# Patient Record
Sex: Female | Born: 1995 | Hispanic: Yes | Marital: Single | Smoking: Former smoker
Health system: Southern US, Community
[De-identification: ages and names within clinical notes are randomized; demographics above are authoritative.]

## PROBLEM LIST (undated history)

## (undated) ENCOUNTER — Inpatient Hospital Stay (HOSPITAL_COMMUNITY): Payer: Self-pay

## (undated) DIAGNOSIS — Z87440 Personal history of urinary (tract) infections: Secondary | ICD-10-CM

## (undated) DIAGNOSIS — I10 Essential (primary) hypertension: Secondary | ICD-10-CM

## (undated) DIAGNOSIS — F419 Anxiety disorder, unspecified: Secondary | ICD-10-CM

## (undated) DIAGNOSIS — J45909 Unspecified asthma, uncomplicated: Secondary | ICD-10-CM

## (undated) DIAGNOSIS — D649 Anemia, unspecified: Secondary | ICD-10-CM

## (undated) DIAGNOSIS — Z789 Other specified health status: Secondary | ICD-10-CM

## (undated) HISTORY — DX: Essential (primary) hypertension: I10

## (undated) HISTORY — DX: Anxiety disorder, unspecified: F41.9

## (undated) HISTORY — DX: Personal history of urinary (tract) infections: Z87.440

## (undated) HISTORY — DX: Anemia, unspecified: D64.9

## (undated) HISTORY — PX: NO PAST SURGERIES: SHX2092

## (undated) HISTORY — DX: Unspecified asthma, uncomplicated: J45.909

## (undated) HISTORY — PX: OTHER SURGICAL HISTORY: SHX169

---
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
## 2016-01-12 ENCOUNTER — Inpatient Hospital Stay (HOSPITAL_COMMUNITY): Payer: Self-pay

## 2016-01-12 ENCOUNTER — Ambulatory Visit (INDEPENDENT_AMBULATORY_CARE_PROVIDER_SITE_OTHER): Payer: Self-pay | Admitting: Family Medicine

## 2016-01-12 ENCOUNTER — Inpatient Hospital Stay (HOSPITAL_COMMUNITY)
Admission: AD | Admit: 2016-01-12 | Discharge: 2016-01-12 | Disposition: A | Discharge status: Home / Self Care | Payer: Self-pay | Source: Ambulatory Visit | Attending: Obstetrics & Gynecology | Admitting: Obstetrics & Gynecology

## 2016-01-12 ENCOUNTER — Encounter (HOSPITAL_COMMUNITY): Payer: Self-pay | Admitting: *Deleted

## 2016-01-12 VITALS — BP 116/72 | HR 82 | Temp 98.3°F | Resp 18 | Ht 67.0 in | Wt 127.2 lb

## 2016-01-12 DIAGNOSIS — O23591 Infection of other part of genital tract in pregnancy, first trimester: Secondary | ICD-10-CM | POA: Insufficient documentation

## 2016-01-12 DIAGNOSIS — R1030 Lower abdominal pain, unspecified: Secondary | ICD-10-CM | POA: Insufficient documentation

## 2016-01-12 DIAGNOSIS — R109 Unspecified abdominal pain: Secondary | ICD-10-CM

## 2016-01-12 DIAGNOSIS — O209 Hemorrhage in early pregnancy, unspecified: Secondary | ICD-10-CM

## 2016-01-12 DIAGNOSIS — O219 Vomiting of pregnancy, unspecified: Secondary | ICD-10-CM

## 2016-01-12 DIAGNOSIS — O26891 Other specified pregnancy related conditions, first trimester: Secondary | ICD-10-CM | POA: Insufficient documentation

## 2016-01-12 DIAGNOSIS — Z87891 Personal history of nicotine dependence: Secondary | ICD-10-CM | POA: Insufficient documentation

## 2016-01-12 DIAGNOSIS — R112 Nausea with vomiting, unspecified: Secondary | ICD-10-CM | POA: Insufficient documentation

## 2016-01-12 DIAGNOSIS — Z3A01 Less than 8 weeks gestation of pregnancy: Secondary | ICD-10-CM | POA: Insufficient documentation

## 2016-01-12 DIAGNOSIS — N939 Abnormal uterine and vaginal bleeding, unspecified: Secondary | ICD-10-CM

## 2016-01-12 DIAGNOSIS — O26899 Other specified pregnancy related conditions, unspecified trimester: Secondary | ICD-10-CM

## 2016-01-12 DIAGNOSIS — O360111 Maternal care for anti-D [Rh] antibodies, first trimester, fetus 1: Secondary | ICD-10-CM

## 2016-01-12 HISTORY — DX: Other specified health status: Z78.9

## 2016-01-12 LAB — WET PREP, GENITAL
Sperm: NONE SEEN
TRICH WET PREP: NONE SEEN
YEAST WET PREP: NONE SEEN

## 2016-01-12 LAB — CBC WITH DIFFERENTIAL/PLATELET
BASOS PCT: 0 %
Basophils Absolute: 0 10*3/uL (ref 0.0–0.1)
EOS ABS: 0 10*3/uL (ref 0.0–0.7)
Eosinophils Relative: 0 %
HEMATOCRIT: 34.9 % — AB (ref 36.0–46.0)
HEMOGLOBIN: 12.5 g/dL (ref 12.0–15.0)
Lymphocytes Relative: 22 %
Lymphs Abs: 1.8 10*3/uL (ref 0.7–4.0)
MCH: 30.9 pg (ref 26.0–34.0)
MCHC: 35.8 g/dL (ref 30.0–36.0)
MCV: 86.2 fL (ref 78.0–100.0)
MONOS PCT: 4 %
Monocytes Absolute: 0.3 10*3/uL (ref 0.1–1.0)
NEUTROS ABS: 5.9 10*3/uL (ref 1.7–7.7)
NEUTROS PCT: 74 %
Platelets: 266 10*3/uL (ref 150–400)
RBC: 4.05 MIL/uL (ref 3.87–5.11)
RDW: 12.9 % (ref 11.5–15.5)
WBC: 8.1 10*3/uL (ref 4.0–10.5)

## 2016-01-12 LAB — URINALYSIS, ROUTINE W REFLEX MICROSCOPIC
GLUCOSE, UA: NEGATIVE mg/dL
Ketones, ur: >80 mg/dL — AB
Nitrite: NEGATIVE
PROTEIN: 100 mg/dL — AB
Specific Gravity, Urine: >1.03 — ABNORMAL HIGH (ref 1.005–1.030)
pH: 6 (ref 5.0–8.0)

## 2016-01-12 LAB — URINE MICROSCOPIC-ADD ON

## 2016-01-12 LAB — POCT URINE PREGNANCY: PREG TEST UR: POSITIVE — AB

## 2016-01-12 LAB — ABO/RH: ABO/RH(D): O NEG

## 2016-01-12 LAB — HCG, QUANTITATIVE, PREGNANCY: HCG, BETA CHAIN, QUANT, S: 65386 m[IU]/mL — AB (ref <5)

## 2016-01-12 MED ORDER — RHO D IMMUNE GLOBULIN 1500 UNIT/2ML IJ SOSY
300.0000 ug | PREFILLED_SYRINGE | Freq: Once | INTRAMUSCULAR | Status: AC
  Administered 2016-01-12: 300 ug via INTRAMUSCULAR
  Filled 2016-01-12: qty 2

## 2016-01-12 MED ORDER — METOCLOPRAMIDE HCL 5 MG/ML IJ SOLN
10.0000 mg | Freq: Once | INTRAMUSCULAR | Status: AC
  Administered 2016-01-12: 10 mg via INTRAVENOUS
  Filled 2016-01-12 (×2): qty 2

## 2016-01-12 MED ORDER — LACTATED RINGERS IV BOLUS (SEPSIS)
1000.0000 mL | Freq: Once | INTRAVENOUS | Status: AC
  Administered 2016-01-12: 1000 mL via INTRAVENOUS

## 2016-01-12 MED ORDER — PROMETHAZINE HCL 25 MG/ML IJ SOLN
25.0000 mg | Freq: Once | INTRAMUSCULAR | Status: AC
  Administered 2016-01-12: 25 mg via INTRAVENOUS
  Filled 2016-01-12: qty 1

## 2016-01-12 MED ORDER — METRONIDAZOLE 0.75 % VA GEL: 1.0000 | Freq: Two times a day (BID) | VAGINAL | Status: DC

## 2016-01-12 MED ORDER — PROMETHAZINE HCL 12.5 MG PO TABS: 12.5000 mg | ORAL_TABLET | Freq: Four times a day (QID) | ORAL | Status: DC | PRN

## 2016-01-12 NOTE — MAU Note (Signed)
Pt sent from Urgent Care - C/O vomiting for the past week, lower abd pain for the last 3 days, spotting since 2/4 but not every day.  Spotted all day yesterday, some today.

## 2016-01-12 NOTE — Progress Notes (Addendum)
Subjective:  By signing my name below, I, Rawaa Al Rifaie, attest that this documentation has been prepared under the direction and in the presence of Meredith Staggers, MD.  Broadus John, Medical Scribe. 01/12/2016.  11:46 AM.   Patient ID: Betty Avila, female    DOB: 1996-07-29, 20 y.o.   MRN: [id number]  Chief Complaint  Patient presents with  . having some morning sickness    HPI HPI Comments: Betty Avila is a 20 y.o. female who presents to [facility] complaining of vomiting/morning sickness, onset 1 weeks ago.  LMP was 11/26/2014. Pt is 6 weeks 4 days by LMP. She had a home pregnancy test on 02/03. She states that this is her first pregnancy, and states that this was planned. she has not followed up with an OB-GYN. She is currently taking pre-natal supplements.  Pt indicates that her morning sickness is very sever, she had 3 episodes of vomiting since 8 am of this morning. Pt states that her vomiting episodes are only in the morning time, however she still feels nausea during the day. She has not taken any medications for her vomiting. She also indicates having associated overall lower abdominal pain onset 2-3 days now. Pt notes that she had some mild bleeding on the 02/02 or 02/03 (the time when her period cycle is expected), and another episode yesterday. She also has associate decreased urine output, she last urinated this morning, she had 3 urination yesterday with dark urine.    There are no active problems to display for this patient.  History reviewed. No pertinent past medical history. History reviewed. No pertinent past surgical history. No Known Allergies Prior to Admission medications   Medication Sig Start Date End Date Taking? Authorizing Provider  cephALEXin (KEFLEX) 500 MG capsule Take 1 capsule (500 mg total) by mouth 2 (two) times daily. Patient not taking: Reported on 01/12/2016 07/22/15   Lenell Antu, DO   Social History     Social History  . Marital Status: Single    Spouse Name: N/A  . Number of Children: N/A  . Years of Education: N/A   Occupational History  . Not on file.   Social History Main Topics  . Smoking status: Current Some Day Smoker -- 1 years    Types: Cigars  . Smokeless tobacco: Not on file     Comment: cigar  . Alcohol Use: No  . Drug Use: No  . Sexual Activity: Not on file   Other Topics Concern  . Not on file   Social History Narrative    Review of Systems  Gastrointestinal: Positive for nausea, vomiting and abdominal pain.  Genitourinary: Positive for vaginal bleeding.      Objective:   Physical Exam  Constitutional: She is oriented to person, place, and time. She appears well-developed and well-nourished. No distress.  HENT:  Head: Normocephalic and atraumatic.  Moist oral mucosa.   Eyes: EOM are normal. Pupils are equal, round, and reactive to light.  Neck: Neck supple.  Cardiovascular: Normal rate, regular rhythm and normal heart sounds.   No murmur heard. Pulmonary/Chest: Effort normal.  Abdominal: There is tenderness. There is no CVA tenderness.  Suprapubic and midline pelvic pain, as well as LLQ and Epigastric.   Neurological: She is alert and oriented to person, place, and time. No cranial nerve deficit.  Skin: Skin is warm and dry.  Psychiatric: She has a normal mood and affect. Her behavior is normal.  Nursing note  and vitals reviewed.   Filed Vitals:   01/12/16 1111  BP: 116/72  Pulse: 82  Temp: 98.3 F (36.8 C)  TempSrc: Oral  Resp: 18  Height:  (1.702 m)  Weight: 127 lb 3.2 oz (57.698 kg)  SpO2: 98%   Results for orders placed or performed in visit on 01/12/16  POCT urine pregnancy  Result Value Ref Range   Preg Test, Ur Positive (A) Negative       Assessment & Plan:  Betty Avila is a 20 y.o. female Vagina bleeding - Plan: POCT urine pregnancy  Abdominal pain, unspecified abdominal location - Plan: POCT urine  pregnancy  Nausea and vomiting, intractability of vomiting not specified, unspecified vomiting type   20 year old female G 1 P0 at 6 weeks and 4 days by LMP of January 1. Recent abdominal cramping and pain with episodic vaginal bleeding/spotting the past few days, primarily complaining of morning sickness for the past week.   - morning sickness information given , but will need further evaluation through MAU today with spotting and abd pain to check for IUP and ddx includes threatened abortion.   -charge nurse at MAU advised.   No orders of the defined types were placed in this encounter.   Patient Instructions   After leaving our office, go to maternity admissions unit at [facility] for further evaluation of the bleeding, abdominal pain, and possible ultrasound to evaluate your pregnancy. They can also discuss treatments for morning sickness further.  See other information on this below.   Morning Sickness Morning sickness is when you feel sick to your stomach (nauseous) during pregnancy. This nauseous feeling may or may not come with vomiting. It often occurs in the morning but can be a problem any time of day. Morning sickness is most common during the first trimester, but it may continue throughout pregnancy. While morning sickness is unpleasant, it is usually harmless unless you develop severe and continual vomiting (hyperemesis gravidarum). This condition requires more intense treatment.  CAUSES  The cause of morning sickness is not completely known but seems to be related to normal hormonal changes that occur in pregnancy. RISK FACTORS You are at greater risk if you:  Experienced nausea or vomiting before your pregnancy.  Had morning sickness during a previous pregnancy.  Are pregnant with more than one baby, such as twins. TREATMENT  Do not use any medicines (prescription, over-the-counter, or herbal) for morning sickness without first talking to your health care provider.  Your health care provider may prescribe or recommend:  Vitamin B6 supplements.  Anti-nausea medicines.  The herbal medicine ginger. HOME CARE INSTRUCTIONS   Only take over-the-counter or prescription medicines as directed by your health care provider.  Taking multivitamins before getting pregnant can prevent or decrease the severity of morning sickness in most women.  Eat a piece of dry toast or unsalted crackers before getting out of bed in the morning.  Eat five or six small meals a day.  Eat dry and bland foods (rice, baked potato). Foods high in carbohydrates are often helpful.  Do not drink liquids with your meals. Drink liquids between meals.  Avoid greasy, fatty, and spicy foods.  Get someone to cook for you if the smell of any food causes nausea and vomiting.  If you feel nauseous after taking prenatal vitamins, take the vitamins at night or with a snack.  Snack on protein foods (nuts, yogurt, cheese) between meals if you are hungry.  Eat unsweetened gelatins for desserts.  Wearing an acupressure wristband (worn for sea sickness) may be helpful.  Acupuncture may be helpful.  Do not smoke.  Get a humidifier to keep the air in your house free of odors.  Get plenty of fresh air. SEEK MEDICAL CARE IF:   Your home remedies are not working, and you need medicine.  You feel dizzy or lightheaded.  You are losing weight. SEEK IMMEDIATE MEDICAL CARE IF:   You have persistent and uncontrolled nausea and vomiting.  You pass out (faint). MAKE SURE YOU:  Understand these instructions.  Will watch your condition.  Will get help right away if you are not doing well or get worse.   This information is not intended to replace advice given to you by your health care provider. Make sure you discuss any questions you have with your health care provider.   Document Released: 01/03/2007 Document Revised: 11/17/2013 Document Reviewed: 04/29/2013 Elsevier Interactive  Patient Education [employer]        I personally performed the services described in this documentation, which was scribed in my presence. The recorded information has been reviewed and considered, and addended by me as needed.

## 2016-01-12 NOTE — Patient Instructions (Signed)
After leaving our office, go to maternity admissions unit at [facility] for further evaluation of the bleeding, abdominal pain, and possible ultrasound to evaluate your pregnancy. They can also discuss treatments for morning sickness further.  See other information on this below.   Morning Sickness Morning sickness is when you feel sick to your stomach (nauseous) during pregnancy. This nauseous feeling may or may not come with vomiting. It often occurs in the morning but can be a problem any time of day. Morning sickness is most common during the first trimester, but it may continue throughout pregnancy. While morning sickness is unpleasant, it is usually harmless unless you develop severe and continual vomiting (hyperemesis gravidarum). This condition requires more intense treatment.  CAUSES  The cause of morning sickness is not completely known but seems to be related to normal hormonal changes that occur in pregnancy. RISK FACTORS You are at greater risk if you:  Experienced nausea or vomiting before your pregnancy.  Had morning sickness during a previous pregnancy.  Are pregnant with more than one baby, such as twins. TREATMENT  Do not use any medicines (prescription, over-the-counter, or herbal) for morning sickness without first talking to your health care provider. Your health care provider may prescribe or recommend:  Vitamin B6 supplements.  Anti-nausea medicines.  The herbal medicine ginger. HOME CARE INSTRUCTIONS   Only take over-the-counter or prescription medicines as directed by your health care provider.  Taking multivitamins before getting pregnant can prevent or decrease the severity of morning sickness in most women.  Eat a piece of dry toast or unsalted crackers before getting out of bed in the morning.  Eat five or six small meals a day.  Eat dry and bland foods (rice, baked potato). Foods high in carbohydrates are often helpful.  Do not drink liquids with  your meals. Drink liquids between meals.  Avoid greasy, fatty, and spicy foods.  Get someone to cook for you if the smell of any food causes nausea and vomiting.  If you feel nauseous after taking prenatal vitamins, take the vitamins at night or with a snack.  Snack on protein foods (nuts, yogurt, cheese) between meals if you are hungry.  Eat unsweetened gelatins for desserts.  Wearing an acupressure wristband (worn for sea sickness) may be helpful.  Acupuncture may be helpful.  Do not smoke.  Get a humidifier to keep the air in your house free of odors.  Get plenty of fresh air. SEEK MEDICAL CARE IF:   Your home remedies are not working, and you need medicine.  You feel dizzy or lightheaded.  You are losing weight. SEEK IMMEDIATE MEDICAL CARE IF:   You have persistent and uncontrolled nausea and vomiting.  You pass out (faint). MAKE SURE YOU:  Understand these instructions.  Will watch your condition.  Will get help right away if you are not doing well or get worse.   This information is not intended to replace advice given to you by your health care provider. Make sure you discuss any questions you have with your health care provider.   Document Released: 01/03/2007 Document Revised: 11/17/2013 Document Reviewed: 04/29/2013 Elsevier Interactive Patient Education [employer]

## 2016-01-12 NOTE — Discharge Instructions (Signed)
First Trimester of Pregnancy The first trimester of pregnancy is from week 1 until the end of week 12 (months 1 through 3). During this time, your baby will begin to develop inside you. At 6-8 weeks, the eyes and face are formed, and the heartbeat can be seen on ultrasound. At the end of 12 weeks, all the baby's organs are formed. Prenatal care is all the medical care you receive before the birth of your baby. Make sure you get good prenatal care and follow all of your doctor's instructions. HOME CARE  Medicines  Take medicine only as told by your doctor. Some medicines are safe and some are not during pregnancy.  Take your prenatal vitamins as told by your doctor.  Take medicine that helps you poop (stool softener) as needed if your doctor says it is okay. Diet  Eat regular, healthy meals.  Your doctor will tell you the amount of weight gain that is right for you.  Avoid raw meat and uncooked cheese.  If you feel sick to your stomach (nauseous) or throw up (vomit):  Eat 4 or 5 small meals a day instead of 3 large meals.  Try eating a few soda crackers.  Drink liquids between meals instead of during meals.  If you have a hard time pooping (constipation):  Eat high-fiber foods like fresh vegetables, fruit, and whole grains.  Drink enough fluids to keep your pee (urine) clear or pale yellow. Activity and Exercise  Exercise only as told by your doctor. Stop exercising if you have cramps or pain in your lower belly (abdomen) or low back.  Try to avoid standing for long periods of time. Move your legs often if you must stand in one place for a long time.  Avoid heavy lifting.  Wear low-heeled shoes. Sit and stand up straight.  You can have sex unless your doctor tells you not to. Relief of Pain or Discomfort  Wear a good support bra if your breasts are sore.  Take warm water baths (sitz baths) to soothe pain or discomfort caused by hemorrhoids. Use hemorrhoid cream if your  doctor says it is okay.  Rest with your legs raised if you have leg cramps or low back pain.  Wear support hose if you have puffy, bulging veins (varicose veins) in your legs. Raise (elevate) your feet for 15 minutes, 3-4 times a day. Limit salt in your diet. Prenatal Care  Schedule your prenatal visits by the twelfth week of pregnancy.  Write down your questions. Take them to your prenatal visits.  Keep all your prenatal visits as told by your doctor. Safety  Wear your seat belt at all times when driving.  Make a list of emergency phone numbers. The list should include numbers for family, friends, the hospital, and police and fire departments. General Tips  Ask your doctor for a referral to a local prenatal class. Begin classes no later than at the start of month 6 of your pregnancy.  Ask for help if you need counseling or help with nutrition. Your doctor can give you advice or tell you where to go for help.  Do not use hot tubs, steam rooms, or saunas.  Do not douche or use tampons or scented sanitary pads.  Do not cross your legs for long periods of time.  Avoid litter boxes and soil used by cats.  Avoid all smoking, herbs, and alcohol. Avoid drugs not approved by your doctor.  Do not use any tobacco products, including cigarettes,  chewing tobacco, and electronic cigarettes. If you need help quitting, ask your doctor. You may get counseling or other support to help you quit. °· Visit your dentist. At home, brush your teeth with a soft toothbrush. Be gentle when you floss. °GET HELP IF: °· You are dizzy. °· You have mild cramps or pressure in your lower belly. °· You have a nagging pain in your belly area. °· You continue to feel sick to your stomach, throw up, or have watery poop (diarrhea). °· You have a bad smelling fluid coming from your vagina. °· You have pain with peeing (urination). °· You have increased puffiness (swelling) in your face, hands, legs, or ankles. °GET HELP  RIGHT AWAY IF:  °· You have a fever. °· You are leaking fluid from your vagina. °· You have spotting or bleeding from your vagina. °· You have very bad belly cramping or pain. °· You gain or lose weight rapidly. °· You throw up blood. It may look like coffee grounds. °· You are around people who have German measles, fifth disease, or chickenpox. °· You have a very bad headache. °· You have shortness of breath. °· You have any kind of trauma, such as from a fall or a car accident. °  °This information is not intended to replace advice given to you by your health care provider. Make sure you discuss any questions you have with your health care provider. °  °Document Released: 04/30/2008 Document Revised: 12/03/2014 Document Reviewed: 09/22/2013 °Elsevier Interactive Patient Education ©2016 Elsevier Inc. °Eating Plan for Hyperemesis Gravidarum °Severe cases of hyperemesis gravidarum can lead to dehydration and malnutrition. The hyperemesis eating plan is one way to lessen the symptoms of nausea and vomiting. It is often used with prescribed medicines to control your symptoms.  °WHAT CAN I DO TO RELIEVE MY SYMPTOMS? °Listen to your body. Everyone is different and has different preferences. Find what works best for you. Some of the following things may help: °· Eat and drink slowly. °· Eat 5-6 small meals daily instead of 3 large meals.   °· Eat crackers before you get out of bed in the morning.   °· Starchy foods are usually well tolerated (such as cereal, toast, bread, potatoes, pasta, rice, and pretzels).   °· Ginger may help with nausea. Add ¼ tsp ground ginger to hot tea or choose ginger tea.   °· Try drinking 100% fruit juice or an electrolyte drink. °· Continue to take your prenatal vitamins as directed by your health care provider. If you are having trouble taking your prenatal vitamins, talk with your health care provider about different options. °· Include at least 1 serving of protein with your meals and  snacks (such as meats or poultry, beans, nuts, eggs, or yogurt). Try eating a protein-rich snack before bed (such as cheese and crackers or a half turkey or peanut butter sandwich). °WHAT THINGS SHOULD I AVOID TO REDUCE MY SYMPTOMS? °The following things may help reduce your symptoms: °· Avoid foods with strong smells. Try eating meals in well-ventilated areas that are free of odors. °· Avoid drinking water or other beverages with meals. Try not to drink anything less than 30 minutes before and after meals. °· Avoid drinking more than 1 cup of fluid at a time. °· Avoid fried or high-fat foods, such as butter and cream sauces. °· Avoid spicy foods. °· Avoid skipping meals the best you can. Nausea can be more intense on an empty stomach. If you cannot tolerate food at that   time, do not force it. Try sucking on ice chips or other frozen items and make up the calories later.  Avoid lying down within 2 hours after eating.   This information is not intended to replace advice given to you by your health care provider. Make sure you discuss any questions you have with your health care provider.   Document Released: 09/09/2007 Document Revised: 11/17/2013 Document Reviewed: 09/16/2013 Elsevier Interactive Patient Education [employer]2016 Elsevier Inc.

## 2016-01-12 NOTE — MAU Provider Note (Signed)
History     CSN: [id number]  Arrival date and time: 01/12/16 1256   First Provider Initiated Contact with Patient 01/12/16 1337      Chief Complaint  Patient presents with  . Emesis During Pregnancy  . Vaginal Bleeding  . Abdominal Pain   HPI Ms. Betty Avila is a 20 y.o. G1P0 at [redacted]w[redacted]d who presents to MAU today with complaint of N/V, abdominal pain and spotting. The patient went to Urgent Care earlier today with the same symptoms and had +UPT and was sent here for further evaluation. She states spotting off and on since 12/31/15. She denies heavy bleeding. She states mild intermittent lower abdominal pain associated with the bleeding. She also states N/V x 1 week. She has not taken anything for nausea or pain. She denies UTI symptoms fever, diarrhea or constipation, although she does state occasional loose stools recently. LMP was 11/27/15.   OB History    Gravida Para Term Preterm AB TAB SAB Ectopic Multiple Living   1               Past Medical History  Diagnosis Date  . Medical history non-contributory     Past Surgical History  Procedure Laterality Date  . No past surgeries      History reviewed. No pertinent family history.  Social History  Substance Use Topics  . Smoking status: Former Smoker -- 1 years    Types: Cigars  . Smokeless tobacco: None     Comment: cigar  . Alcohol Use: No    Allergies: No Known Allergies  No prescriptions prior to admission    Review of Systems  Constitutional: Negative for fever and malaise/fatigue.  Gastrointestinal: Positive for nausea, vomiting and abdominal pain. Negative for diarrhea and constipation.  Genitourinary: Negative for dysuria, urgency and frequency.       + spotting   Physical Exam   Blood pressure 115/69, pulse 61, resp. rate 16, last menstrual period 11/27/2015.  Physical Exam  Nursing note and vitals reviewed. Constitutional: She is oriented to person, place, and time. She appears  well-developed and well-nourished. No distress.  HENT:  Head: Normocephalic and atraumatic.  Cardiovascular: Normal rate.   Respiratory: Effort normal.  GI: Soft. Bowel sounds are normal. She exhibits no distension and no mass. There is tenderness (mild tenderness to palpation of the epigastric region and LUQ). There is no rebound and no guarding.  Genitourinary: Uterus is enlarged (slightly) and tender (mild). Cervix exhibits no motion tenderness, no discharge and no friability. Right adnexum displays tenderness (mild). Right adnexum displays no mass. Left adnexum displays tenderness (mild). Left adnexum displays no mass. There is bleeding (scant light brown) in the vagina. Vaginal discharge (small amount of thin, white-brown discharge noted with mild odor) found.  Neurological: She is alert and oriented to person, place, and time.  Skin: Skin is warm and dry. No erythema.  Psychiatric: She has a normal mood and affect.    Results for orders placed or performed during the hospital encounter of 01/12/16 (from the past 24 hour(s))  Urinalysis, Routine w reflex microscopic (not at [facility])     Status: Abnormal   Collection Time: 01/12/16  1:07 PM  Result Value Ref Range   Color, Urine YELLOW YELLOW   APPearance CLEAR CLEAR   Specific Gravity, Urine >1.030 (H) 1.005 - 1.030   pH 6.0 5.0 - 8.0   Glucose, UA NEGATIVE NEGATIVE mg/dL   Hgb urine dipstick LARGE (A) NEGATIVE   Bilirubin Urine SMALL (  A) NEGATIVE   Ketones, ur >80 (A) NEGATIVE mg/dL   Protein, ur 161 (A) NEGATIVE mg/dL   Nitrite NEGATIVE NEGATIVE   Leukocytes, UA TRACE (A) NEGATIVE  Urine microscopic-add on     Status: Abnormal   Collection Time: 01/12/16  1:07 PM  Result Value Ref Range   Squamous Epithelial / LPF 6-30 (A) NONE SEEN   WBC, UA 0-5 0 - 5 WBC/hpf   RBC / HPF 6-30 0 - 5 RBC/hpf   Bacteria, UA MANY (A) NONE SEEN   Crystals CA OXALATE CRYSTALS (A) NEGATIVE   Urine-Other MUCOUS PRESENT   Wet prep, genital     Status:  Abnormal   Collection Time: 01/12/16  1:45 PM  Result Value Ref Range   Yeast Wet Prep HPF POC NONE SEEN NONE SEEN   Trich, Wet Prep NONE SEEN NONE SEEN   Clue Cells Wet Prep HPF POC PRESENT (A) NONE SEEN   WBC, Wet Prep HPF POC MANY (A) NONE SEEN   Sperm NONE SEEN   CBC with Differential/Platelet     Status: Abnormal   Collection Time: 01/12/16  2:00 PM  Result Value Ref Range   WBC 8.1 4.0 - 10.5 K/uL   RBC 4.05 3.87 - 5.11 MIL/uL   Hemoglobin 12.5 12.0 - 15.0 g/dL   HCT 09.6 (L) 04.5 - 40.9 %   MCV 86.2 78.0 - 100.0 fL   MCH 30.9 26.0 - 34.0 pg   MCHC 35.8 30.0 - 36.0 g/dL   RDW 81.1 91.4 - 78.2 %   Platelets 266 150 - 400 K/uL   Neutrophils Relative % 74 %   Neutro Abs 5.9 1.7 - 7.7 K/uL   Lymphocytes Relative 22 %   Lymphs Abs 1.8 0.7 - 4.0 K/uL   Monocytes Relative 4 %   Monocytes Absolute 0.3 0.1 - 1.0 K/uL   Eosinophils Relative 0 %   Eosinophils Absolute 0.0 0.0 - 0.7 K/uL   Basophils Relative 0 %   Basophils Absolute 0.0 0.0 - 0.1 K/uL  ABO/Rh     Status: None   Collection Time: 01/12/16  2:00 PM  Result Value Ref Range   ABO/RH(D) O NEG   hCG, quantitative, pregnancy     Status: Abnormal   Collection Time: 01/12/16  2:00 PM  Result Value Ref Range   hCG, Beta Chain, Quant, S 65386 (H) <5 mIU/mL  Rh IG workup (includes ABO/Rh)     Status: None (Preliminary result)   Collection Time: 01/12/16  2:00 PM  Result Value Ref Range   Gestational Age(Wks) 6    ABO/RH(D) O NEG    Unit Number [id number]    Blood Component Type RHIG    Unit division 00    Status of Unit ISSUED    Transfusion Status OK TO TRANSFUSE    US Ob Comp Less 14 Wks  01/12/2016  CLINICAL DATA:  Vaginal bleeding before [redacted] weeks gestation O20.9 (ICD-10-CM) Abdominal pain affecting pregnancy, antepartum O99.89, R10.9 (ICD-10-CM) EXAM: OBSTETRIC <14 WK [place] AND TRANSVAGINAL OB US TECHNIQUE: Both transabdominal and transvaginal ultrasound examinations were performed for complete evaluation of the  gestation as well as the maternal uterus, adnexal regions, and pelvic cul-de-sac. Transvaginal technique was performed to assess early pregnancy. COMPARISON:  None. FINDINGS: Intrauterine gestational sac: Visualized/normal in shape. Yolk sac:  Yes Embryo:  Yes Cardiac Activity: Yes Heart Rate: 111  bpm CRL:  3.4  mm   6 weeks w   0 d  [place] EDC: 09/06/2016 Subchorionic hemorrhage:  None visualized. Maternal uterus/adnexae: Unremarkable.  No pelvic free fluid. IMPRESSION: Single live intrauterine pregnancy with a measured gestational age of [redacted] weeks. No evidence of a pregnancy complication. Electronically Signed   By: Amie Portland M.D.   On: 01/12/2016 14:39   US Ob Transvaginal  01/12/2016  CLINICAL DATA:  Vaginal bleeding before [redacted] weeks gestation O20.9 (ICD-10-CM) Abdominal pain affecting pregnancy, antepartum O99.89, R10.9 (ICD-10-CM) EXAM: OBSTETRIC <14 WK [place] AND TRANSVAGINAL OB US TECHNIQUE: Both transabdominal and transvaginal ultrasound examinations were performed for complete evaluation of the gestation as well as the maternal uterus, adnexal regions, and pelvic cul-de-sac. Transvaginal technique was performed to assess early pregnancy. COMPARISON:  None. FINDINGS: Intrauterine gestational sac: Visualized/normal in shape. Yolk sac:  Yes Embryo:  Yes Cardiac Activity: Yes Heart Rate: 111  bpm CRL:  3.4  mm   6 weeks w   0 d                  [place] EDC: 09/06/2016 Subchorionic hemorrhage:  None visualized. Maternal uterus/adnexae: Unremarkable.  No pelvic free fluid. IMPRESSION: Single live intrauterine pregnancy with a measured gestational age of [redacted] weeks. No evidence of a pregnancy complication. Electronically Signed   By: Amie Portland M.D.   On: 01/12/2016 14:39    MAU Course  Procedures None  MDM +UPT at Urgent Care today UA, wet prep, GC/chlamydia, CBC, ABO/Rh, quant hCG, HIV, RPR and [place] today to rule out ectopic pregnancy Urine culture pending O neg - work-up ordered Rhogam  given 1 liter IV LR with 25 mg Phenergan given. Patient reports improvement in symptoms. No emesis while in MAU, but continued nausea.  1 liter IV LR with 10 mg Reglan given. Patient is resting comfortably at time of discharge. Still no emesis while in MAU. Able to tolerate ice chips Assessment and Plan  A: SIUP at [redacted]w[redacted]d with normal cardiac activity Bacterial vaginosis Nausea and vomiting in pregnancy prior to [redacted] weeks gestation  P: Discharge home Rx for Phenergan and Metrogel given to patient  First trimtesr precautions and diet for N/V in pregnancy discussed Patient advised to follow-up with OB provider of choice. List of area OB providers given. Pregnancy confirmation letter given.  Patient may return to MAU as needed or if her condition were to change or worsen   Marny Lowenstein, PA-C  01/12/2016, 4:52 PM

## 2016-01-13 LAB — CULTURE, OB URINE: Culture: 1000

## 2016-01-13 LAB — RH IG WORKUP (INCLUDES ABO/RH)
ABO/RH(D): O NEG
GESTATIONAL AGE(WKS): 6
Unit division: 0

## 2016-01-13 LAB — GC/CHLAMYDIA PROBE AMP (~~LOC~~) NOT AT ARMC
CHLAMYDIA, DNA PROBE: NEGATIVE
Neisseria Gonorrhea: NEGATIVE

## 2016-01-13 LAB — HIV ANTIBODY (ROUTINE TESTING W REFLEX): HIV SCREEN 4TH GENERATION: NONREACTIVE

## 2016-01-13 LAB — RPR: RPR: NONREACTIVE

## 2016-03-22 ENCOUNTER — Ambulatory Visit: Payer: Self-pay | Admitting: Certified Nurse Midwife

## 2016-04-11 ENCOUNTER — Inpatient Hospital Stay (HOSPITAL_COMMUNITY)
Admission: AD | Admit: 2016-04-11 | Discharge: 2016-04-11 | Disposition: A | Discharge status: Home / Self Care | Payer: Self-pay | Source: Ambulatory Visit | Attending: Obstetrics and Gynecology | Admitting: Obstetrics and Gynecology

## 2016-04-11 ENCOUNTER — Encounter (HOSPITAL_COMMUNITY): Payer: Self-pay | Admitting: *Deleted

## 2016-04-11 DIAGNOSIS — Z3A19 19 weeks gestation of pregnancy: Secondary | ICD-10-CM | POA: Insufficient documentation

## 2016-04-11 DIAGNOSIS — Z1389 Encounter for screening for other disorder: Secondary | ICD-10-CM

## 2016-04-11 DIAGNOSIS — N760 Acute vaginitis: Secondary | ICD-10-CM | POA: Insufficient documentation

## 2016-04-11 DIAGNOSIS — Z3492 Encounter for supervision of normal pregnancy, unspecified, second trimester: Secondary | ICD-10-CM

## 2016-04-11 DIAGNOSIS — R109 Unspecified abdominal pain: Secondary | ICD-10-CM | POA: Insufficient documentation

## 2016-04-11 DIAGNOSIS — B9689 Other specified bacterial agents as the cause of diseases classified elsewhere: Secondary | ICD-10-CM

## 2016-04-11 DIAGNOSIS — O219 Vomiting of pregnancy, unspecified: Secondary | ICD-10-CM

## 2016-04-11 DIAGNOSIS — O23592 Infection of other part of genital tract in pregnancy, second trimester: Secondary | ICD-10-CM | POA: Insufficient documentation

## 2016-04-11 DIAGNOSIS — O210 Mild hyperemesis gravidarum: Secondary | ICD-10-CM | POA: Insufficient documentation

## 2016-04-11 LAB — URINALYSIS, ROUTINE W REFLEX MICROSCOPIC
Bilirubin Urine: NEGATIVE
GLUCOSE, UA: NEGATIVE mg/dL
Ketones, ur: NEGATIVE mg/dL
LEUKOCYTES UA: NEGATIVE
NITRITE: NEGATIVE
PROTEIN: NEGATIVE mg/dL
Specific Gravity, Urine: 1.02 (ref 1.005–1.030)
pH: 6.5 (ref 5.0–8.0)

## 2016-04-11 LAB — URINE MICROSCOPIC-ADD ON

## 2016-04-11 LAB — WET PREP, GENITAL
SPERM: NONE SEEN
Trich, Wet Prep: NONE SEEN
YEAST WET PREP: NONE SEEN

## 2016-04-11 MED ORDER — ONDANSETRON 8 MG PO TBDP: 8.0000 mg | ORAL_TABLET | Freq: Three times a day (TID) | ORAL | Status: DC | PRN

## 2016-04-11 MED ORDER — METRONIDAZOLE 500 MG PO TABS: 500.0000 mg | ORAL_TABLET | Freq: Two times a day (BID) | ORAL | Status: DC

## 2016-04-11 NOTE — Discharge Instructions (Signed)

## 2016-04-11 NOTE — MAU Provider Note (Signed)
History     CSN: [id number]  Arrival date and time: 04/11/16 1252   First Provider Initiated Contact with Patient 04/11/16 1442         Chief Complaint  Patient presents with  . Morning Sickness  . Abdominal Pain   HPI  Betty Avila is a 20 y.o. G1P0 at 3623w3d who presents for nausea & concerns about baby. States she is concerned b/c she [facility] and is concerned that baby isn't growing because she's "not showing enough". Requesting ultrasound.  Reports continued nausea and occasional vomiting throughout pregnancy. Has not vomited today. Does not have nausea medication at home.  Reports occasional lower abdominal pain every few days that is constant when it comes. Worse with movement. Denies abdominal pain at this time.  Denies vaginal bleeding or LOF.  Has started to feel the baby move.     OB History    Gravida Para Term Preterm AB TAB SAB Ectopic Multiple Living   1               Past Medical History  Diagnosis Date  . Medical history non-contributory     Past Surgical History  Procedure Laterality Date  . No past surgeries      History reviewed. No pertinent family history.  Social History  Substance Use Topics  . Smoking status: Never Smoker   . Smokeless tobacco: Never Used     Comment: cigar  . Alcohol Use: No    Allergies: No Known Allergies  No prescriptions prior to admission    Review of Systems  Constitutional: Negative.   Gastrointestinal: Positive for nausea, vomiting and abdominal pain. Negative for diarrhea and constipation.  Genitourinary: Negative.    Physical Exam   Blood pressure 106/63, pulse 76, temperature 98.1 F (36.7 C), temperature source Oral, resp. rate 16, height 5\' 7"  (1.702 m), weight 135 lb (61.236 kg), last menstrual period 11/27/2015.  Physical Exam  Nursing note and vitals reviewed. Constitutional: She is oriented to person, place, and time. She appears  well-developed and well-nourished. No distress.  HENT:  Head: Normocephalic and atraumatic.  Eyes: Conjunctivae are normal. Right eye exhibits no discharge. Left eye exhibits no discharge. No scleral icterus.  Neck: Normal range of motion.  Cardiovascular: Normal rate, regular rhythm and normal heart sounds.   No murmur heard. Respiratory: Effort normal and breath sounds normal. No respiratory distress. She has no wheezes.  GI: Soft. Bowel sounds are normal. There is no tenderness.  Fundal height 19 cm  Genitourinary: No bleeding in the vagina. Vaginal discharge (small amount of clear mucoid discharge) found.  Cervix closed  Neurological: She is alert and oriented to person, place, and time.  Skin: Skin is warm and dry. She is not diaphoretic.  Psychiatric: She has a normal mood and affect. Her behavior is normal. Judgment and thought content normal.    MAU Course  Procedures Results for orders placed or performed during the hospital encounter of 04/11/16 (from the past 24 hour(s))  Urinalysis, Routine w reflex microscopic ([facility])     Status: Abnormal   Collection Time: 04/11/16  1:27 PM  Result Value Ref Range   Color, Urine YELLOW YELLOW   APPearance HAZY (A) CLEAR   Specific Gravity, Urine 1.020 1.005 - 1.030   pH 6.5 5.0 - 8.0   Glucose, UA NEGATIVE NEGATIVE mg/dL   Hgb urine dipstick TRACE (A) NEGATIVE   Bilirubin Urine NEGATIVE NEGATIVE  Ketones, ur NEGATIVE NEGATIVE mg/dL   Protein, ur NEGATIVE NEGATIVE mg/dL   Nitrite NEGATIVE NEGATIVE   Leukocytes, UA NEGATIVE NEGATIVE  Urine microscopic-add on     Status: Abnormal   Collection Time: 04/11/16  1:27 PM  Result Value Ref Range   Squamous Epithelial / LPF 0-5 (A) NONE SEEN   WBC, UA 0-5 0 - 5 WBC/hpf   RBC / HPF 0-5 0 - 5 RBC/hpf   Bacteria, UA FEW (A) NONE SEEN   Crystals CA OXALATE CRYSTALS (A) NEGATIVE   Urine-Other MUCOUS PRESENT   Wet prep, genital     Status: Abnormal   Collection Time: 04/11/16  3:28 PM   Result Value Ref Range   Yeast Wet Prep HPF POC NONE SEEN NONE SEEN   Trich, Wet Prep NONE SEEN NONE SEEN   Clue Cells Wet Prep HPF POC PRESENT (A) NONE SEEN   WBC, Wet Prep HPF POC MODERATE (A) NONE SEEN   Sperm NONE SEEN     MDM FHT 160 by doppler Fundal height - 19 cm Cervix closed Outpatient anatomy scan scheduled for patient as patient's rescheduled appt with [facility] isn't until after 23 weeks.   Assessment and Plan  A: 1. Nausea and vomiting during pregnancy prior to [redacted] weeks gestation   2. Fetal heart tones present, second trimester   3. BV (bacterial vaginosis)   4. Encounter for routine screening for malformation using ultrasonics     P; Discharge home Rx flagyl & zofran Discussed reasons to come to MAU Strongly encouraged patient to keep all ob appts Return to anatomy ultrasound on 5/24 at 1 pm GC/CT pending  Judeth Horn 04/11/2016, 2:30 PM

## 2016-04-11 NOTE — MAU Note (Signed)
Pt requesting U/S, was seen in MAU previously for vomiting, is still vomiting some but not as much.  Has some decreased FM, worried about baby.  Missed appointment @ [facility] at 1100 today.  Denies bleeding or LOF.  Has some spotting with wiping three days ago.

## 2016-04-12 ENCOUNTER — Encounter (HOSPITAL_COMMUNITY): Payer: Self-pay | Admitting: Obstetrics and Gynecology

## 2016-04-12 LAB — GC/CHLAMYDIA PROBE AMP (~~LOC~~) NOT AT ARMC
Chlamydia: POSITIVE — AB
NEISSERIA GONORRHEA: NEGATIVE

## 2016-04-16 ENCOUNTER — Encounter (HOSPITAL_COMMUNITY): Payer: Self-pay | Admitting: *Deleted

## 2016-04-18 ENCOUNTER — Ambulatory Visit (HOSPITAL_COMMUNITY)
Admit: 2016-04-18 | Discharge: 2016-04-18 | Disposition: A | Discharge status: Home / Self Care | Payer: Self-pay | Attending: Obstetrics and Gynecology | Admitting: Obstetrics and Gynecology

## 2016-04-18 ENCOUNTER — Other Ambulatory Visit (HOSPITAL_COMMUNITY): Payer: Self-pay | Admitting: Student

## 2016-04-18 DIAGNOSIS — Z3A20 20 weeks gestation of pregnancy: Secondary | ICD-10-CM | POA: Insufficient documentation

## 2016-04-18 DIAGNOSIS — O0932 Supervision of pregnancy with insufficient antenatal care, second trimester: Secondary | ICD-10-CM

## 2016-04-18 DIAGNOSIS — Z1389 Encounter for screening for other disorder: Secondary | ICD-10-CM

## 2016-04-18 DIAGNOSIS — Z36 Encounter for antenatal screening of mother: Secondary | ICD-10-CM | POA: Insufficient documentation

## 2016-04-18 DIAGNOSIS — Z3689 Encounter for other specified antenatal screening: Secondary | ICD-10-CM

## 2016-04-25 ENCOUNTER — Telehealth: Payer: Self-pay | Admitting: General Practice

## 2016-04-25 NOTE — Telephone Encounter (Signed)
Called patient and informed her of u/s results & recommendation for f/u in 4 weeks. Patient verbalized understanding & had no questions

## 2016-05-16 ENCOUNTER — Encounter: Payer: Self-pay | Admitting: Advanced Practice Midwife

## 2016-05-16 LAB — POCT URINALYSIS DIP (DEVICE)
BILIRUBIN URINE: NEGATIVE
GLUCOSE, UA: NEGATIVE mg/dL
Hgb urine dipstick: NEGATIVE
KETONES UR: NEGATIVE mg/dL
Nitrite: NEGATIVE
Protein, ur: NEGATIVE mg/dL
Specific Gravity, Urine: 1.025 (ref 1.005–1.030)
Urobilinogen, UA: 0.2 mg/dL (ref 0.0–1.0)
pH: 7 (ref 5.0–8.0)

## 2016-05-17 LAB — OB RESULTS CONSOLE GC/CHLAMYDIA: GC PROBE AMP, GENITAL: NEGATIVE

## 2016-05-17 LAB — OB RESULTS CONSOLE RUBELLA ANTIBODY, IGM: Rubella: NON-IMMUNE/NOT IMMUNE

## 2016-05-17 LAB — OB RESULTS CONSOLE HEPATITIS B SURFACE ANTIGEN: Hepatitis B Surface Ag: NEGATIVE

## 2016-08-06 LAB — OB RESULTS CONSOLE GBS: GBS: NEGATIVE

## 2016-08-31 ENCOUNTER — Inpatient Hospital Stay (HOSPITAL_COMMUNITY): Payer: Medicaid Other | Admitting: Anesthesiology

## 2016-08-31 ENCOUNTER — Encounter (HOSPITAL_COMMUNITY): Payer: Self-pay

## 2016-08-31 ENCOUNTER — Inpatient Hospital Stay (HOSPITAL_COMMUNITY)
Admission: AD | Admit: 2016-08-31 | Discharge: 2016-09-02 | DRG: 775 | Disposition: A | Discharge status: Home / Self Care | Payer: Medicaid Other | Source: Ambulatory Visit | Attending: Obstetrics and Gynecology | Admitting: Obstetrics and Gynecology

## 2016-08-31 DIAGNOSIS — Z3A39 39 weeks gestation of pregnancy: Secondary | ICD-10-CM

## 2016-08-31 DIAGNOSIS — O4202 Full-term premature rupture of membranes, onset of labor within 24 hours of rupture: Secondary | ICD-10-CM | POA: Diagnosis present

## 2016-08-31 DIAGNOSIS — O99824 Streptococcus B carrier state complicating childbirth: Secondary | ICD-10-CM | POA: Diagnosis not present

## 2016-08-31 LAB — CBC
HCT: 33 % — ABNORMAL LOW (ref 36.0–46.0)
HEMOGLOBIN: 11.4 g/dL — AB (ref 12.0–15.0)
MCH: 30.7 pg (ref 26.0–34.0)
MCHC: 34.5 g/dL (ref 30.0–36.0)
MCV: 88.9 fL (ref 78.0–100.0)
PLATELETS: 155 10*3/uL (ref 150–400)
RBC: 3.71 MIL/uL — AB (ref 3.87–5.11)
RDW: 14.3 % (ref 11.5–15.5)
WBC: 9.8 10*3/uL (ref 4.0–10.5)

## 2016-08-31 LAB — RAPID URINE DRUG SCREEN, HOSP PERFORMED
AMPHETAMINES: NOT DETECTED
Barbiturates: NOT DETECTED
Benzodiazepines: NOT DETECTED
COCAINE: NOT DETECTED
OPIATES: NOT DETECTED
TETRAHYDROCANNABINOL: NOT DETECTED

## 2016-08-31 LAB — POCT FERN TEST: POCT Fern Test: POSITIVE

## 2016-08-31 MED ORDER — OXYCODONE-ACETAMINOPHEN 5-325 MG PO TABS: 2.0000 | ORAL_TABLET | ORAL | Status: DC | PRN

## 2016-08-31 MED ORDER — ACETAMINOPHEN 325 MG PO TABS: 650.0000 mg | ORAL_TABLET | ORAL | Status: DC | PRN

## 2016-08-31 MED ORDER — FLEET ENEMA 7-19 GM/118ML RE ENEM: 1.0000 | ENEMA | RECTAL | Status: DC | PRN

## 2016-08-31 MED ORDER — DIPHENHYDRAMINE HCL 50 MG/ML IJ SOLN: 12.5000 mg | INTRAMUSCULAR | Status: DC | PRN

## 2016-08-31 MED ORDER — EPHEDRINE 5 MG/ML INJ
10.0000 mg | INTRAVENOUS | Status: DC | PRN
  Filled 2016-08-31: qty 4

## 2016-08-31 MED ORDER — OXYCODONE-ACETAMINOPHEN 5-325 MG PO TABS: 1.0000 | ORAL_TABLET | ORAL | Status: DC | PRN

## 2016-08-31 MED ORDER — MISOPROSTOL 200 MCG PO TABS
800.0000 ug | ORAL_TABLET | Freq: Once | ORAL | Status: AC
  Administered 2016-08-31: 800 ug via RECTAL

## 2016-08-31 MED ORDER — FENTANYL 2.5 MCG/ML BUPIVACAINE 1/10 % EPIDURAL INFUSION (WH - ANES)
14.0000 mL/h | INTRAMUSCULAR | Status: DC | PRN
  Administered 2016-08-31 (×2): 14 mL/h via EPIDURAL
  Filled 2016-08-31: qty 125

## 2016-08-31 MED ORDER — MISOPROSTOL 200 MCG PO TABS
ORAL_TABLET | ORAL | Status: AC
  Filled 2016-08-31: qty 4

## 2016-08-31 MED ORDER — LACTATED RINGERS IV SOLN
INTRAVENOUS | Status: DC
  Administered 2016-08-31: 1000 mL via INTRAVENOUS
  Administered 2016-08-31: 10:00:00 via INTRAVENOUS

## 2016-08-31 MED ORDER — TERBUTALINE SULFATE 1 MG/ML IJ SOLN
0.2500 mg | Freq: Once | INTRAMUSCULAR | Status: DC | PRN
  Filled 2016-08-31: qty 1

## 2016-08-31 MED ORDER — OXYTOCIN 40 UNITS IN LACTATED RINGERS INFUSION - SIMPLE MED
2.5000 [IU]/h | INTRAVENOUS | Status: DC
  Administered 2016-09-01: 2.5 [IU]/h via INTRAVENOUS

## 2016-08-31 MED ORDER — ONDANSETRON HCL 4 MG/2ML IJ SOLN: 4.0000 mg | Freq: Four times a day (QID) | INTRAMUSCULAR | Status: DC | PRN

## 2016-08-31 MED ORDER — PHENYLEPHRINE 40 MCG/ML (10ML) SYRINGE FOR IV PUSH (FOR BLOOD PRESSURE SUPPORT)
80.0000 ug | PREFILLED_SYRINGE | INTRAVENOUS | Status: DC | PRN
  Filled 2016-08-31: qty 5

## 2016-08-31 MED ORDER — LACTATED RINGERS IV SOLN: 500.0000 mL | Freq: Once | INTRAVENOUS | Status: DC

## 2016-08-31 MED ORDER — SOD CITRATE-CITRIC ACID 500-334 MG/5ML PO SOLN: 30.0000 mL | ORAL | Status: DC | PRN

## 2016-08-31 MED ORDER — OXYTOCIN 40 UNITS IN LACTATED RINGERS INFUSION - SIMPLE MED
1.0000 m[IU]/min | INTRAVENOUS | Status: DC
  Administered 2016-08-31: 2 m[IU]/min via INTRAVENOUS
  Filled 2016-08-31: qty 1000

## 2016-08-31 MED ORDER — LACTATED RINGERS IV SOLN: 500.0000 mL | INTRAVENOUS | Status: DC | PRN

## 2016-08-31 MED ORDER — OXYTOCIN BOLUS FROM INFUSION
500.0000 mL | Freq: Once | INTRAVENOUS | Status: AC
  Administered 2016-09-01: 500 mL via INTRAVENOUS

## 2016-08-31 MED ORDER — LIDOCAINE HCL (PF) 1 % IJ SOLN
30.0000 mL | INTRAMUSCULAR | Status: DC | PRN
  Filled 2016-08-31: qty 30

## 2016-08-31 MED ORDER — PHENYLEPHRINE 40 MCG/ML (10ML) SYRINGE FOR IV PUSH (FOR BLOOD PRESSURE SUPPORT)
80.0000 ug | PREFILLED_SYRINGE | INTRAVENOUS | Status: DC | PRN
  Filled 2016-08-31: qty 10
  Filled 2016-08-31: qty 5

## 2016-08-31 MED ORDER — LIDOCAINE HCL (PF) 1 % IJ SOLN
INTRAMUSCULAR | Status: DC | PRN
  Administered 2016-08-31: 7 mL via EPIDURAL
  Administered 2016-08-31: 6 mL via EPIDURAL

## 2016-08-31 NOTE — Anesthesia Procedure Notes (Signed)
Epidural Patient location during procedure: OB Start time: 08/31/2016 5:26 PM End time: 08/31/2016 5:30 PM  Staffing Anesthesiologist: Leilani AbleHATCHETT, Amylah Will Performed: anesthesiologist   Preanesthetic Checklist Completed: patient identified, surgical consent, pre-op evaluation, timeout performed, IV checked, risks and benefits discussed and monitors and equipment checked  Epidural Patient position: sitting Prep: site prepped and draped and DuraPrep Patient monitoring: continuous pulse ox and blood pressure Approach: midline Location: L3-L4 Injection technique: LOR air  Needle:  Needle type: Tuohy  Needle gauge: 17 G Needle length: 9 cm and 9 Needle insertion depth: 5 cm cm Catheter type: closed end flexible Catheter size: 19 Gauge Catheter at skin depth: 10 cm Test dose: negative and Other  Assessment Sensory level: T9 Events: blood not aspirated, injection not painful, no injection resistance, negative IV test and no paresthesia  Additional Notes Reason for block:procedure for pain

## 2016-08-31 NOTE — MAU Note (Signed)
Pt felt gush of fluid at 0745 and has been leaking since.

## 2016-08-31 NOTE — Anesthesia Pain Management Evaluation Note (Signed)
  [facility] Pain Management Visit Note  Patient: Betty Avila, 20 y.o., female  "Hello I am a member of the anesthesia team at [facility]. We have an anesthesia team available at all times to provide care throughout the hospital, including epidural management and anesthesia for C-section. I don't know your plan for the delivery whether it a natural birth, water birth, IV sedation, nitrous supplementation, doula or epidural, but we want to meet your pain goals."   1.Was your pain managed to your expectations on prior hospitalizations?     2.What is your expectation for pain management during this hospitalization?    3.How can we help you reach that goal?   Record the patient's initial score and the patient's pain goal.   Pain: 2  Pain Goal: 5 [facility] wants you to be able to say your pain was always managed very well.  Betty Avila 08/31/2016

## 2016-08-31 NOTE — H&P (Signed)
LABOR AND DELIVERY ADMISSION HISTORY AND PHYSICAL NOTE  Betty Avila is a 20 y.o. female G1P0 with IUP at 2455w5d by 1st trimester US presenting for SROM.  Felt gush of clear fluids at 7:45 this morning. She has been feeling contractions about every 4-5 minutes. She reports positive fetal movement. She denies vaginal bleeding.  Prenatal History/Complications:  Past Medical History: Past Medical History:  Diagnosis Date  . Medical history non-contributory     Past Surgical History: Past Surgical History:  Procedure Laterality Date  . NO PAST SURGERIES      Obstetrical History: OB History    Gravida Para Term Preterm AB Living   1             SAB TAB Ectopic Multiple Live Births                  Social History: Social History   Social History  . Marital status: Single    Spouse name: N/A  . Number of children: N/A  . Years of education: N/A   Social History Main Topics  . Smoking status: Never Smoker  . Smokeless tobacco: Never Used     Comment: cigar  . Alcohol use No  . Drug use: No  . Sexual activity: Yes    Birth control/ protection: None     Comment: last had intercourse on 04/10/16   Other Topics Concern  . None   Social History Narrative  . None    Family History: History reviewed. No pertinent family history.  Allergies: No Known Allergies  Prescriptions Prior to Admission  Medication Sig Dispense Refill Last Dose  . acetaminophen (TYLENOL) 500 MG tablet Take 500-1,000 mg by mouth every 6 (six) hours as needed for mild pain, moderate pain or headache.   Past Month at Unknown time  . Prenatal Vit-Fe Fumarate-FA (PRENATAL MULTIVITAMIN) TABS tablet Take 1 tablet by mouth every evening.    08/30/2016 at Unknown time     Review of Systems   All systems reviewed and negative except as stated in HPI  Blood pressure 129/87, pulse 76, temperature 98.1 F (36.7 C), resp. rate 16, height 5\' 7"  (1.702 m), weight 73.5 kg (162 lb), last  menstrual period 11/27/2015. General appearance: alert, cooperative and no distress Lungs: no respiratory distress Heart: regular rate Abdomen: soft, non-tender Extremities: No calf swelling or tenderness Presentation: cephalic Fetal monitoring: baseline 140, moderate variability, accelerations present no decelerations noted Uterine activity: q5 minutes on toco Dilation: 3 Effacement (%): 60, 50 Station: -2 Exam by:: Ginnie Smartachel Schmidt RN   Prenatal labs: ABO, Rh: --/--/O NEG (10/06 [id number]) Antibody: PENDING (10/06 0952) Rubella: non-immune RPR: Non Reactive (02/16 1400)  HBsAg:    HIV: Non Reactive (02/16 1400)  GBS:     Prenatal Transfer Tool  Maternal Diabetes: No Genetic Screening: Normal Maternal Ultrasounds/Referrals: Normal Fetal Ultrasounds or other Referrals:  Referred to Materal Fetal Medicine  Maternal Substance Abuse:  Yes:  Type: Marijuana Significant Maternal Medications:  None Significant Maternal Lab Results: None  Results for orders placed or performed during the hospital encounter of 08/31/16 (from the past 24 hour(s))  CBC   Collection Time: 08/31/16  9:52 AM  Result Value Ref Range   WBC 9.8 4.0 - 10.5 K/uL   RBC 3.71 (L) 3.87 - 5.11 MIL/uL   Hemoglobin 11.4 (L) 12.0 - 15.0 g/dL   HCT 41.333.0 (L) 24.436.0 - 01.046.0 %   MCV 88.9 78.0 - 100.0 fL   MCH 30.7 26.0 -  34.0 pg   MCHC 34.5 30.0 - 36.0 g/dL   RDW 16.1 09.6 - 04.5 %   Platelets 155 150 - 400 K/uL  Type and screen [facility]   Collection Time: 08/31/16  9:52 AM  Result Value Ref Range   ABO/RH(D) O NEG    Antibody Screen PENDING    Sample Expiration 09/03/2016     There are no active problems to display for this patient.   Assessment: Betty Avila is a 20 y.o. G1P0 at [redacted]w[redacted]d here for SROM, SOL  #Labor: SOL #Pain: Would like to try IV pain medication, possibly epidural later on  #FWB: Category 1 tracing #ID:  GBS #MOF: breast #MOC:IUD #Circ:  n/a  Tillman Sers, DO PGY-1 10/6/201711:34 AM   OB FELLOW HISTORY AND PHYSICAL ATTESTATION  I have seen and examined this patient; I agree with above documentation in the resident's note.    Jen Mow, DO [place] Fellow 08/31/2016

## 2016-08-31 NOTE — Anesthesia Preprocedure Evaluation (Signed)
Anesthesia Evaluation  Patient identified by MRN, date of birth, ID band Patient awake    Reviewed: Allergy & Precautions, H&P , NPO status , Patient's Chart, lab work & pertinent test results  Airway Mallampati: I  TM Distance: >3 FB Neck ROM: full    Dental no notable dental hx.    Pulmonary neg pulmonary ROS,    Pulmonary exam normal        Cardiovascular negative cardio ROS Normal cardiovascular exam     Neuro/Psych negative neurological ROS  negative psych ROS   GI/Hepatic negative GI ROS, Neg liver ROS,   Endo/Other  negative endocrine ROS  Renal/GU negative Renal ROS     Musculoskeletal   Abdominal (+) + obese,   Peds  Hematology negative hematology ROS (+)   Anesthesia Other Findings   Reproductive/Obstetrics (+) Pregnancy                             Anesthesia Physical Anesthesia Plan  ASA: II  Anesthesia Plan: Epidural   Post-op Pain Management:    Induction:   Airway Management Planned:   Additional Equipment:   Intra-op Plan:   Post-operative Plan:   Informed Consent: I have reviewed the patients History and Physical, chart, labs and discussed the procedure including the risks, benefits and alternatives for the proposed anesthesia with the patient or authorized representative who has indicated his/her understanding and acceptance.     Plan Discussed with:   Anesthesia Plan Comments:         Anesthesia Quick Evaluation  

## 2016-09-01 ENCOUNTER — Encounter (HOSPITAL_COMMUNITY): Payer: Self-pay

## 2016-09-01 DIAGNOSIS — O99824 Streptococcus B carrier state complicating childbirth: Secondary | ICD-10-CM

## 2016-09-01 DIAGNOSIS — Z3A39 39 weeks gestation of pregnancy: Secondary | ICD-10-CM

## 2016-09-01 LAB — RPR: RPR Ser Ql: NONREACTIVE

## 2016-09-01 MED ORDER — ZOLPIDEM TARTRATE 5 MG PO TABS: 5.0000 mg | ORAL_TABLET | Freq: Every evening | ORAL | Status: DC | PRN

## 2016-09-01 MED ORDER — SENNOSIDES-DOCUSATE SODIUM 8.6-50 MG PO TABS
2.0000 | ORAL_TABLET | ORAL | Status: DC
  Administered 2016-09-02: 2 via ORAL
  Filled 2016-09-01: qty 2

## 2016-09-01 MED ORDER — BENZOCAINE-MENTHOL 20-0.5 % EX AERO
1.0000 "application " | INHALATION_SPRAY | CUTANEOUS | Status: DC | PRN
  Administered 2016-09-01: 1 via TOPICAL
  Filled 2016-09-01: qty 56

## 2016-09-01 MED ORDER — IBUPROFEN 600 MG PO TABS
600.0000 mg | ORAL_TABLET | Freq: Four times a day (QID) | ORAL | Status: DC
  Administered 2016-09-01 – 2016-09-02 (×7): 600 mg via ORAL
  Filled 2016-09-01 (×6): qty 1

## 2016-09-01 MED ORDER — METHYLERGONOVINE MALEATE 0.2 MG PO TABS: 0.2000 mg | ORAL_TABLET | ORAL | Status: DC | PRN

## 2016-09-01 MED ORDER — DIPHENHYDRAMINE HCL 25 MG PO CAPS: 25.0000 mg | ORAL_CAPSULE | Freq: Four times a day (QID) | ORAL | Status: DC | PRN

## 2016-09-01 MED ORDER — MEASLES, MUMPS & RUBELLA VAC ~~LOC~~ INJ
0.5000 mL | INJECTION | Freq: Once | SUBCUTANEOUS | Status: AC
  Administered 2016-09-02: 0.5 mL via SUBCUTANEOUS
  Filled 2016-09-01 (×2): qty 0.5

## 2016-09-01 MED ORDER — ACETAMINOPHEN 325 MG PO TABS
650.0000 mg | ORAL_TABLET | ORAL | Status: DC | PRN
  Administered 2016-09-01 (×2): 650 mg via ORAL
  Filled 2016-09-01 (×2): qty 2

## 2016-09-01 MED ORDER — PRENATAL MULTIVITAMIN CH
1.0000 | ORAL_TABLET | Freq: Every day | ORAL | Status: DC
  Administered 2016-09-01 – 2016-09-02 (×2): 1 via ORAL
  Filled 2016-09-01 (×2): qty 1

## 2016-09-01 MED ORDER — OXYTOCIN 40 UNITS IN LACTATED RINGERS INFUSION - SIMPLE MED
83.3000 m[IU]/min | INTRAVENOUS | Status: DC
  Administered 2016-09-01: 83.3 m[IU]/min via INTRAVENOUS
  Filled 2016-09-01 (×2): qty 1000

## 2016-09-01 MED ORDER — DIBUCAINE 1 % RE OINT: 1.0000 "application " | TOPICAL_OINTMENT | RECTAL | Status: DC | PRN

## 2016-09-01 MED ORDER — COCONUT OIL OIL
1.0000 "application " | TOPICAL_OIL | Status: DC | PRN
  Administered 2016-09-01: 1 via TOPICAL
  Filled 2016-09-01: qty 120

## 2016-09-01 MED ORDER — METHYLERGONOVINE MALEATE 0.2 MG/ML IJ SOLN
0.2000 mg | INTRAMUSCULAR | Status: DC | PRN
  Administered 2016-09-01: 0.2 mg via INTRAMUSCULAR

## 2016-09-01 MED ORDER — ONDANSETRON HCL 4 MG/2ML IJ SOLN: 4.0000 mg | INTRAMUSCULAR | Status: DC | PRN

## 2016-09-01 MED ORDER — METHYLERGONOVINE MALEATE 0.2 MG/ML IJ SOLN
INTRAMUSCULAR | Status: AC
  Filled 2016-09-01: qty 1

## 2016-09-01 MED ORDER — TETANUS-DIPHTH-ACELL PERTUSSIS 5-2.5-18.5 LF-MCG/0.5 IM SUSP: 0.5000 mL | Freq: Once | INTRAMUSCULAR | Status: DC

## 2016-09-01 MED ORDER — ONDANSETRON HCL 4 MG PO TABS: 4.0000 mg | ORAL_TABLET | ORAL | Status: DC | PRN

## 2016-09-01 MED ORDER — SIMETHICONE 80 MG PO CHEW: 80.0000 mg | CHEWABLE_TABLET | ORAL | Status: DC | PRN

## 2016-09-01 MED ORDER — WITCH HAZEL-GLYCERIN EX PADS: 1.0000 "application " | MEDICATED_PAD | CUTANEOUS | Status: DC | PRN

## 2016-09-01 MED ORDER — INFLUENZA VAC SPLIT QUAD 0.5 ML IM SUSY
0.5000 mL | PREFILLED_SYRINGE | INTRAMUSCULAR | Status: AC
  Administered 2016-09-02: 0.5 mL via INTRAMUSCULAR
  Filled 2016-09-01: qty 0.5

## 2016-09-01 NOTE — Progress Notes (Signed)
Post Partum Day #1 Subjective: up ad lib, voiding and tolerating PO; breastfeeding going well; desires IUD for contraception; has not had repeat T since 0220  Objective: Blood pressure 129/67, pulse 67, temperature (!) 100.8 F (38.2 C), temperature source Oral, resp. rate 20, height 5\' 7"  (1.702 m), weight 73.5 kg (162 lb), last menstrual period 11/27/2015, unknown if currently breastfeeding.  Physical Exam:  General: alert, cooperative and no distress Lochia: appropriate Uterine Fundus: firm DVT Evaluation: No evidence of DVT seen on physical exam.   Recent Labs  08/31/16 0952  HGB 11.4*  HCT 33.0*    Assessment/Plan: Plan for discharge tomorrow  Recheck mat temp   LOS: 1 day   SHAW, KIMBERLY CNM 09/01/2016, 7:18 AM

## 2016-09-01 NOTE — Anesthesia Postprocedure Evaluation (Signed)
Anesthesia Post Note  Patient: Betty Avila  Procedure(s) Performed: * No procedures listed *  Patient location during evaluation: Mother Baby Anesthesia Type: Epidural Level of consciousness: awake and alert Pain management: pain level controlled Vital Signs Assessment: post-procedure vital signs reviewed and stable Respiratory status: spontaneous breathing and nonlabored ventilation Cardiovascular status: stable Postop Assessment: no headache, patient able to bend at knees, no backache, no signs of nausea or vomiting, epidural receding and adequate PO intake Anesthetic complications: no     Last Vitals:  Vitals:   09/01/16 0220 09/01/16 0320  BP: (!) 152/82 129/67  Pulse: 63 67  Resp: 20 20  Temp: (!) 38.2 C     Last Pain:  Vitals:   09/01/16 0320  TempSrc:   PainSc: 2    Pain Goal: Patients Stated Pain Goal: 2 (09/01/16 0220)               Laban EmperorMalinova,Yailen Zemaitis Hristova

## 2016-09-01 NOTE — Lactation Note (Signed)
This note was copied from a baby's chart. Lactation Consultation Note  Patient Name: Betty Avila ZOXWR'UToday's Date: 09/01/2016 Reason for consult: Initial assessment  Visited with Mom for initial assessment, baby 4617 hrs old.  First baby of this 20 yo Mom.  Baby 5473w5d, weighing 7 lbs 10 oz.  400 EBL with manual removal of clots, cytotec and Methergine given.   Mom resting in bed, and baby dressed and sleeping next to her.  Baby last fed 4 1/2 hrs ago.  Mom also states her nipples hurt when baby is breastfeeding.  Encouraged her to undress baby to a diaper and place her skin to skin.  Mom wanted to let baby sleep.  Talked about importance of frequent feedings, and offered to assist her with positioning and latch.  Assisted with latching baby in football hold.  Mom wanting to use a scissor hold when latching.  Explained how this will flatten breast and baby will more likely latch shallow to the breast.  Assisted with using the C hold and baby latched deeply.  Teaching done on how to identify baby swallows.  Mom stated this latch was more comfortable.  Encouraged her to use her colostrum on her nipples for soreness.  No trauma noted on nipples.  Encouraged skin to skin, and feeding on cue.  Basic teaching done with Mom. Brochure left in room.  Explained about IP and OP lactation services available to her.  Lactation to f/u tomorrow. To call for help prn.  Consult Status Consult Status: Follow-up Date: 09/02/16 Follow-up type: In-patient    Betty Avila, Betty Avila 09/01/2016, 5:10 PM

## 2016-09-01 NOTE — Progress Notes (Signed)
Upon entering room, patient noted to be crying. FOB on couch with eyes shut.  Asked patient if she needed anything and she asked for tissues. FOB got up and went to bathroom, I again asked patient if I could do anything for her, and she said no, that she will call someone if she needs something.

## 2016-09-01 NOTE — Clinical Social Work Maternal (Signed)
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 [place], [place] [address]  Phone number:  [phone]   Household Members: Self, Significant Other   Natural Supports (not living in the home): Spouse/significant other   Professional Supports:None   Employment:    Type of Work:     Education:  9 to 11 years   Financial Resources:Self-Pay    Other Resources:     Cultural/Religious Considerations Which May Impact Care: None reported.   Strengths: Home prepared for child , Pediatrician chosen , Ability to meet basic needs    Risk Factors/Current Problems: Mental Health Concerns    Cognitive State: Alert , Insightful    Mood/Affect: Calm , Interested    CSW Assessment:CSW met with MOB at bedside. This writer stated reasoning for visit being due to her hx of substance use and to assess potential mental health concerns. At this time, MOB stated she did use during pregnancy and her last use was in June. This writer informed her of the hospitals policy and procedure. This writer further stated that a UDS and cord blood test were taken. MOB verbalized understanding. This writer informed her that the UDS was negative and the cord blood test is still pending. MOB verbalized no additional  concerns regarding substance.   This writer discussed MOB feelings about being a new mom and if she would like resources for outpatient therapeutic services. MOB declined at this time. This writer discussed PPD and SIDS with MOB. She verbalized understanding. This writer told MOB if she changes her mind regarding resources/referrals for outpatient services she is available to assist.   CSW will continue to follow pending cord blood test resuls.   CSW Plan/Description: Psychosocial Support and Ongoing Assessment of Needs   Brittnae Aschenbrenner, MSW, LCSW-A Clinical Social Worker  [facility]  Office: [phone]       CLINICAL SOCIAL WORK MATERNAL/CHILD NOTE  Patient Details  Name: Betty Avila MRN: [id number] Date of Birth: Feb 28, 1996  Date:  09/01/2016  Clinical Social Worker Initiating Note:  Ferdinand Lango Ival Pacer, MSW, LCSW-A Date/ Time Initiated:  09/01/16/1251     Child's Name:  [name]   Legal Guardian:  Other (Comment) (Not established by the court; Both MOB and FOB are parenting collectively)   Need for Interpreter:  None   Date of Referral:  08/31/16     Reason for Referral:  Current Substance Use/Substance Use During Pregnancy , Other (Comment) (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 [place],  [address]  Phone number:  [phone]   Household Members:  Self, Significant Other   Natural Supports (not living in the home):  Spouse/significant other   Professional Supports: None   Employment:     Type of Work:     Education:  9 to 11 years   Financial Resources:  Self-Pay    Other Resources:      Cultural/Religious Considerations Which May Impact Care:  None reported.   Strengths:  Home prepared for child , Pediatrician chosen , Ability to meet basic needs    Risk Factors/Current Problems:  Mental Health Concerns    Cognitive State:  Alert , Insightful    Mood/Affect:  Calm , Interested    CSW Assessment: CSW met with MOB at bedside. This Probation officer stated reasoning for visit being due to her hx of substance use and to assess potential mental health concerns. At this time, MOB stated she did use during pregnancy and her last use was in June. This Probation officer informed her of the hospitals policy and procedure. This Probation officer further stated that a UDS and cord blood test were taken. MOB verbalized understanding. This Probation officer informed her that the UDS was negative and the cord blood test is still pending. MOB verbalized no additional concerns regarding  substance.   This Probation officer discussed MOB feelings about being a new mom and if she would like resources for outpatient therapeutic services. MOB declined at this time. This Probation officer discussed PPD and SIDS with MOB. She verbalized understanding. This writer told MOB if she changes her mind regarding resources/referrals for outpatient services she is available to assist.   CSW will continue to follow pending cord blood test resuls.   CSW Plan/Description:  Psychosocial Support and Ongoing Assessment of Needs   Water quality scientist, MSW, LCSW-A Clinical Social Worker  [facility]  Office: [phone]

## 2016-09-02 DIAGNOSIS — O99824 Streptococcus B carrier state complicating childbirth: Secondary | ICD-10-CM

## 2016-09-02 DIAGNOSIS — Z3A39 39 weeks gestation of pregnancy: Secondary | ICD-10-CM

## 2016-09-02 MED ORDER — IBUPROFEN 600 MG PO TABS: 600.0000 mg | ORAL_TABLET | Freq: Four times a day (QID) | ORAL | 0 refills | Status: DC

## 2016-09-02 NOTE — Discharge Summary (Signed)
Obstetric Discharge Summary Reason for Admission: rupture of membranes and 39.5 weeks Prenatal Procedures: ultrasound Intrapartum Procedures: spontaneous vaginal delivery Postpartum Procedures: none and Rubella Ig Complications-Operative and Postpartum: 1st degree perineal laceration Hemoglobin  Date Value Ref Range Status  08/31/2016 11.4 (L) 12.0 - 15.0 g/dL Final   HCT  Date Value Ref Range Status  08/31/2016 33.0 (L) 36.0 - 46.0 % Final    Physical Exam:  General: alert, cooperative and no distress Lochia: appropriate Uterine Fundus: firm Incision: healing well, no significant drainage, no dehiscence, no significant erythema DVT Evaluation: No evidence of DVT seen on physical exam. Negative Homan's sign. No cords or calf tenderness. No significant calf/ankle edema.  Discharge Diagnoses: Term Pregnancy-delivered  Discharge Information: Date: 09/02/2016 Activity: pelvic rest Diet: routine Medications: PNV and Ibuprofen Condition: stable Instructions: refer to practice specific booklet Discharge to: home [facility]-up Information    [facility] Schedule an appointment as soon as possible for a visit in 6 week(s).   Contact information: [address] [place] [id number] [phone]           Newborn Data: Live born female  Birth Weight: 7 lb 10.8 oz (3480 g) APGAR: 8, 9  Home with mother.  Donette LarryMelanie Harvey Lingo, CNM 09/02/2016, 8:28 AM

## 2016-09-02 NOTE — Lactation Note (Signed)
This note was copied from a baby's chart. Lactation Consultation Note  Patient Name: Betty Avila YNWGN'FToday's Date: 09/02/2016  Pecola LeisureBaby has been cluster feeding.  Reassured parents this is normal.  Discussed milk coming to volume and engorgement treatment.  Reviewed Baby and Me book.  Manual pump given with instructions. Questions answered.  Instructed to continue to feed with any feeding cue and use good breast massage during feeding.  Outpatient lactation services and support information reviewed and encouraged.   Maternal Data    Feeding Length of feed: 15 min  LATCH Score/Interventions                      Lactation Tools Discussed/Used     Consult Status      Huston FoleyMOULDEN, Naasia Weilbacher S 09/02/2016, 9:17 AM

## 2016-09-04 LAB — TYPE AND SCREEN
ABO/RH(D): O NEG
ANTIBODY SCREEN: POSITIVE
DAT, IgG: NEGATIVE
Unit division: 0
Unit division: 0

## 2016-10-15 ENCOUNTER — Ambulatory Visit (INDEPENDENT_AMBULATORY_CARE_PROVIDER_SITE_OTHER): Payer: Self-pay | Admitting: Student

## 2016-10-15 ENCOUNTER — Ambulatory Visit (INDEPENDENT_AMBULATORY_CARE_PROVIDER_SITE_OTHER): Payer: Self-pay | Admitting: Clinical

## 2016-10-15 VITALS — BP 110/62 | HR 62 | Wt 139.5 lb

## 2016-10-15 DIAGNOSIS — Z30011 Encounter for initial prescription of contraceptive pills: Secondary | ICD-10-CM

## 2016-10-15 DIAGNOSIS — F4323 Adjustment disorder with mixed anxiety and depressed mood: Secondary | ICD-10-CM

## 2016-10-15 DIAGNOSIS — Z3202 Encounter for pregnancy test, result negative: Secondary | ICD-10-CM

## 2016-10-15 MED ORDER — NORGESTIMATE-ETH ESTRADIOL 0.25-35 MG-MCG PO TABS: 1.0000 | ORAL_TABLET | Freq: Every day | ORAL | 11 refills | Status: DC

## 2016-10-15 NOTE — Patient Instructions (Addendum)
Contraception Choices Contraception (birth control) is the use of any methods or devices to prevent pregnancy. Below are some methods to help avoid pregnancy. Hormonal methods  Contraceptive implant. This is a thin, plastic tube containing progesterone hormone. It does not contain estrogen hormone. Your health care provider inserts the tube in the inner part of the upper arm. The tube can remain in place for up to 3 years. After 3 years, the implant must be removed. The implant prevents the ovaries from releasing an egg (ovulation), thickens the cervical mucus to prevent sperm from entering the uterus, and thins the lining of the inside of the uterus.  Progesterone-only injections. These injections are given every 3 months by your health care provider to prevent pregnancy. This synthetic progesterone hormone stops the ovaries from releasing eggs. It also thickens cervical mucus and changes the uterine lining. This makes it harder for sperm to survive in the uterus.  Birth control pills. These pills contain estrogen and progesterone hormone. They work by preventing the ovaries from releasing eggs (ovulation). They also cause the cervical mucus to thicken, preventing the sperm from entering the uterus. Birth control pills are prescribed by a health care provider.Birth control pills can also be used to treat heavy periods.  Minipill. This type of birth control pill contains only the progesterone hormone. They are taken every day of each month and must be prescribed by your health care provider.  Birth control patch. The patch contains hormones similar to those in birth control pills. It must be changed once a week and is prescribed by a health care provider.  Vaginal ring. The ring contains hormones similar to those in birth control pills. It is left in the vagina for 3 weeks, removed for 1 week, and then a new one is put back in place. The patient must be comfortable inserting and removing the ring from  the vagina.A health care provider's prescription is necessary.  Emergency contraception. Emergency contraceptives prevent pregnancy after unprotected sexual intercourse. This pill can be taken right after sex or up to 5 days after unprotected sex. It is most effective the sooner you take the pills after having sexual intercourse. Most emergency contraceptive pills are available without a prescription. Check with your pharmacist. Do not use emergency contraception as your only form of birth control. Barrier methods  Female condom. This is a thin sheath (latex or rubber) that is worn over the penis during sexual intercourse. It can be used with spermicide to increase effectiveness.  Female condom. This is a soft, loose-fitting sheath that is put into the vagina before sexual intercourse.  Diaphragm. This is a soft, latex, dome-shaped barrier that must be fitted by a health care provider. It is inserted into the vagina, along with a spermicidal jelly. It is inserted before intercourse. The diaphragm should be left in the vagina for 6 to 8 hours after intercourse.  Cervical cap. This is a round, soft, latex or plastic cup that fits over the cervix and must be fitted by a health care provider. The cap can be left in place for up to 48 hours after intercourse.  Sponge. This is a soft, circular piece of polyurethane foam. The sponge has spermicide in it. It is inserted into the vagina after wetting it and before sexual intercourse.  Spermicides. These are chemicals that kill or block sperm from entering the cervix and uterus. They come in the form of creams, jellies, suppositories, foam, or tablets. They do not require a prescription. They   are inserted into the vagina with an applicator before having sexual intercourse. The process must be repeated every time you have sexual intercourse. Intrauterine contraception  Intrauterine device (IUD). This is a T-shaped device that is put in a woman's uterus during  a menstrual period to prevent pregnancy. There are 2 types: ? Copper IUD. This type of IUD is wrapped in copper wire and is placed inside the uterus. Copper makes the uterus and fallopian tubes produce a fluid that kills sperm. It can stay in place for 10 years. ? Hormone IUD. This type of IUD contains the hormone progestin (synthetic progesterone). The hormone thickens the cervical mucus and prevents sperm from entering the uterus, and it also thins the uterine lining to prevent implantation of a fertilized egg. The hormone can weaken or kill the sperm that get into the uterus. It can stay in place for 3-5 years, depending on which type of IUD is used. Permanent methods of contraception  Female tubal ligation. This is when the woman's fallopian tubes are surgically sealed, tied, or blocked to prevent the egg from traveling to the uterus.  Hysteroscopic sterilization. This involves placing a small coil or insert into each fallopian tube. Your doctor uses a technique called hysteroscopy to do the procedure. The device causes scar tissue to form. This results in permanent blockage of the fallopian tubes, so the sperm cannot fertilize the egg. It takes about 3 months after the procedure for the tubes to become blocked. You must use another form of birth control for these 3 months.  Female sterilization. This is when the female has the tubes that carry sperm tied off (vasectomy).This blocks sperm from entering the vagina during sexual intercourse. After the procedure, the man can still ejaculate fluid (semen). Natural planning methods  Natural family planning. This is not having sexual intercourse or using a barrier method (condom, diaphragm, cervical cap) on days the woman could become pregnant.  Calendar method. This is keeping track of the length of each menstrual cycle and identifying when you are fertile.  Ovulation method. This is avoiding sexual intercourse during ovulation.  Symptothermal method.  This is avoiding sexual intercourse during ovulation, using a thermometer and ovulation symptoms.  Post-ovulation method. This is timing sexual intercourse after you have ovulated. Regardless of which type or method of contraception you choose, it is important that you use condoms to protect against the transmission of sexually transmitted infections (STIs). Talk with your health care provider about which form of contraception is most appropriate for you. This information is not intended to replace advice given to you by your health care provider. Make sure you discuss any questions you have with your health care provider. Document Released: 11/12/2005 Document Revised: 04/19/2016 Document Reviewed: 05/07/2013 Elsevier Interactive Patient Education  2017 [employer]  

## 2016-10-15 NOTE — BH Specialist Note (Addendum)
Session Start time: 2:35   End Time: 3:04 Total Time:  29 minutes Type of Service: Behavioral Health - Individual/Family Interpreter: No.   Interpreter Name & Language: n/a # [facility]-June [facility]   SUBJECTIVE: Betty Avila is a 20 y.o. female  Pt. was referred by Judeth HornErin Lawrence, NP for:  anxiety and depression. Pt. reports the following symptoms/concerns: Pt states that she thinks she sleeps too much, both during pregnancy and increased postpartum, and worries about having occasional nightmares. Pt says she copes by watching shows with FOB, and "trying not to think about it"; looking forward to plans to start going back to church again in the few weeks. 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  Pt may benefit from psychoeducation and brief therapeutic interventions regarding coping with symptoms of anxiety and depression.  PLAN: 1. F/U with behavioral health clinician:Two weeks, or as needed, if symptoms increase and/or do not improve 2. Behavioral Health meds: none 3. Behavioral recommendations:  -If symptoms of  depression and anxiety do not improve, or stay the same, discuss with medical provider about adding BH medication to treatment -Continue to watch shows together daily that help mood to improve (and avoid sad shows that increase feelings of depression) -[facility] on Tuesday and Thursday mornings for additional social support -Pt and FOB come to a decision about how soon to start going back to church weekly, for additional social support -Read educational materials regarding coping with symptoms of anxiety and depression(and educational resource about using antidepressants successfully) 4. Referral: Brief Counseling/Psychotherapy, Chartered loss adjusterCommunity Resource and Psychoeducation   Woc-Behavioral Health Clinician  Behavioral Health Clinician  Marlon PelWarmhandoff:   Warm Hand Off Completed.        [facility] 10/15/2016 01/12/2016 07/22/2015  Decreased Interest 2 0 0  Down, Depressed, Hopeless 1 0 0  PHQ - 2 Score 3 0 0  Altered sleeping 3 - -  Tired, decreased energy 1 - -  Change in appetite 2 - -  Feeling bad or failure about yourself  1 - -  Trouble concentrating 3 - -  Moving slowly or fidgety/restless 1 - -  Suicidal thoughts 0 - -  PHQ-9 Score 14 - -   GAD 7 : Generalized Anxiety Score 10/15/2016  Nervous, Anxious, on Edge 2  Control/stop worrying 2  Worry too much - different things 3  Trouble relaxing 2  Restless 1  Easily annoyed or irritable 2  Afraid - awful might happen 2  Total GAD 7 Score 14

## 2016-10-15 NOTE — Progress Notes (Signed)
Subjective:     Betty Avila is a 20 y.o. female who presents for a postpartum visit. She is 6 weeks postpartum following a spontaneous vaginal delivery. I have fully reviewed the prenatal and intrapartum course. The delivery was at 39 gestational weeks. Outcome: vaginal. Anesthesia: Epidual. Postpartum course has been uncomplicated. Baby's course  uncomplicated. Baby is feeding by breast. Bleeding not at this time. Bowel function is normal. Bladder function is normal. Patient is sexually active. Contraception method is nothing at this time but desire depo-provera. Postpartum depression screening: positive   The following portions of the patient's history were reviewed and updated as appropriate: allergies, current medications, past family history, past medical history, past social history, past surgical history and problem list.  Review of Systems Pertinent items are noted in HPI.   Objective:    BP 110/62   Pulse 62   Wt 139 lb 8 oz (63.3 kg)   BMI 21.85 kg/m   General:  cooperative, appears stated age and distracted  Lungs: clear to auscultation bilaterally  Heart:  regular rate and rhythm, S1, S2 normal, no murmur, click, rub or gallop  Abdomen: soft, non-tender; bowel sounds normal; no masses,  no organomegaly       Assessment:     Normal postpartum exam. Pap smear not done at today's visit.   Plan:    1. Contraception: OCP (estrogen/progesterone) 2. Msg to [facility]olin[facility] for teen clinic -- pt interested in [facility] 3. Follow up in: 1 year or as needed.

## 2016-10-16 LAB — POCT PREGNANCY, URINE: PREG TEST UR: NEGATIVE

## 2017-02-11 IMAGING — US US OB TRANSVAGINAL
1 series · 15 of 28 positions shown · non-contrast
Comparison: None.

CLINICAL DATA: Vaginal bleeding before 22 weeks gestation
([id number])
Abdominal pain affecting pregnancy, antepartum O99.89,

EXAM:
OBSTETRIC <14 WK US AND TRANSVAGINAL OB US
TECHNIQUE: Both transabdominal and transvaginal ultrasound examinations were
performed for complete evaluation of the gestation as well as the
maternal uterus, adnexal regions, and pelvic cul-de-sac.
Transvaginal technique was performed to assess early pregnancy.

[Series 1: us ob transvaginal · 15 of 40 slices shown]
[im 1/40]
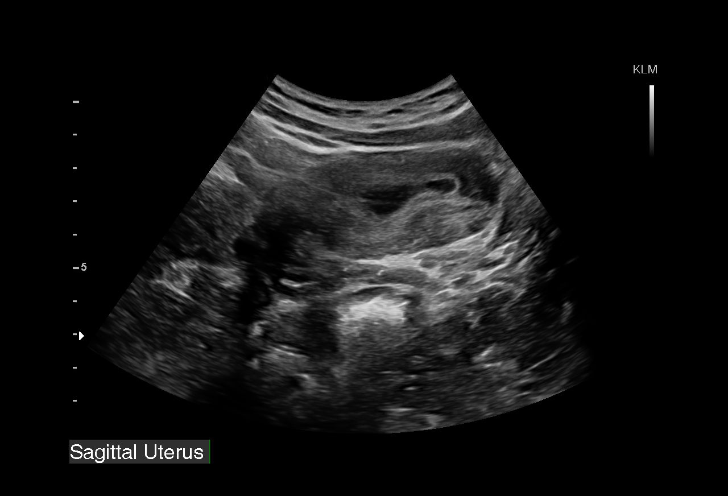
[im 3/40]
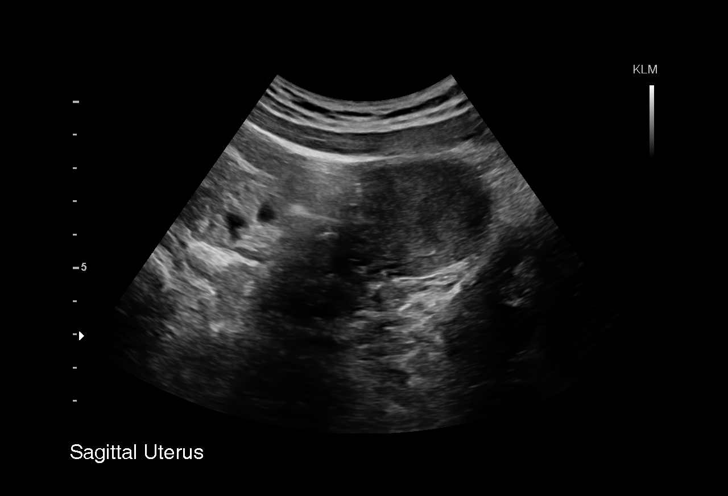
[im 6/40]
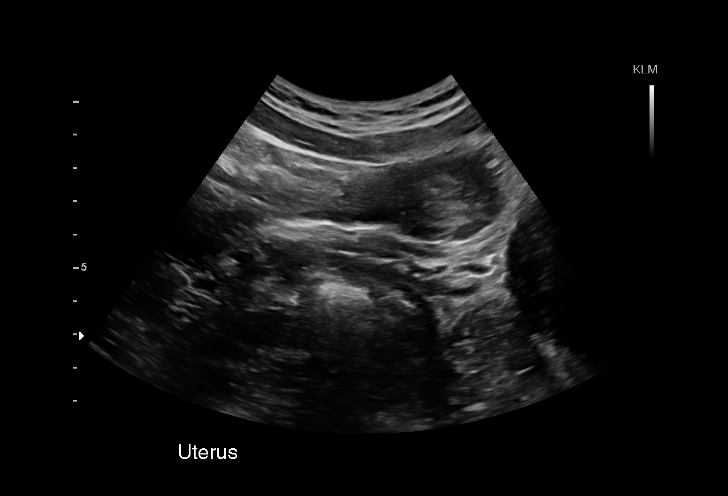
[im 9/40]
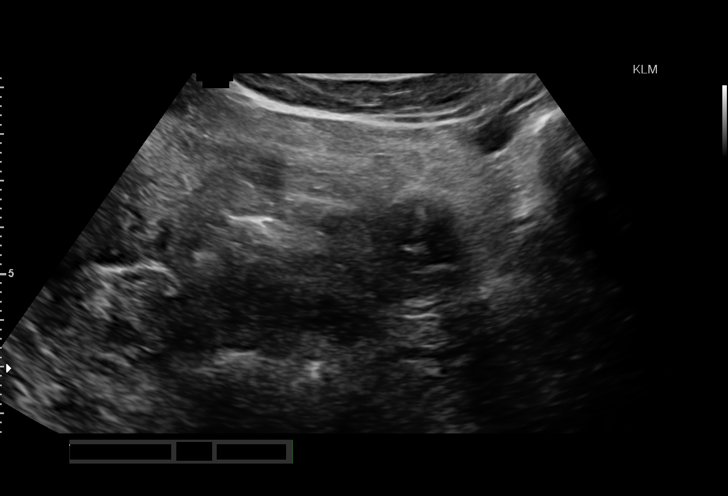
[im 12/40]
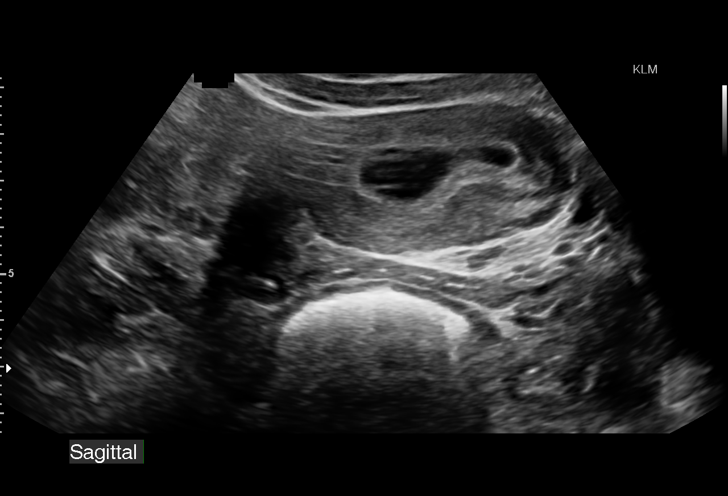
[im 15/40]
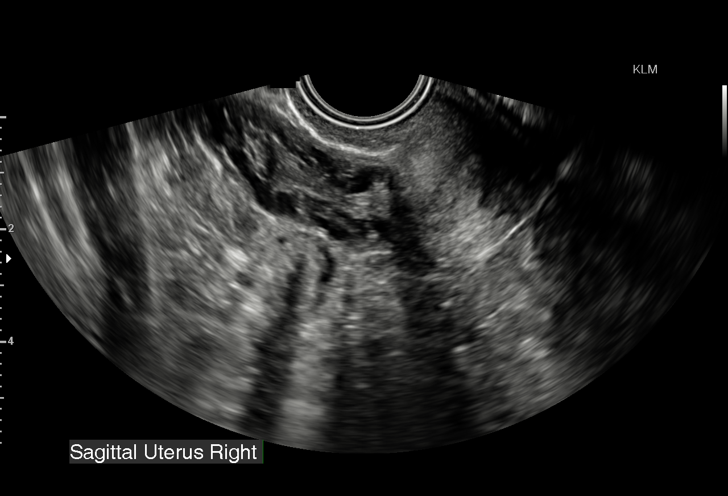
[im 18/40]
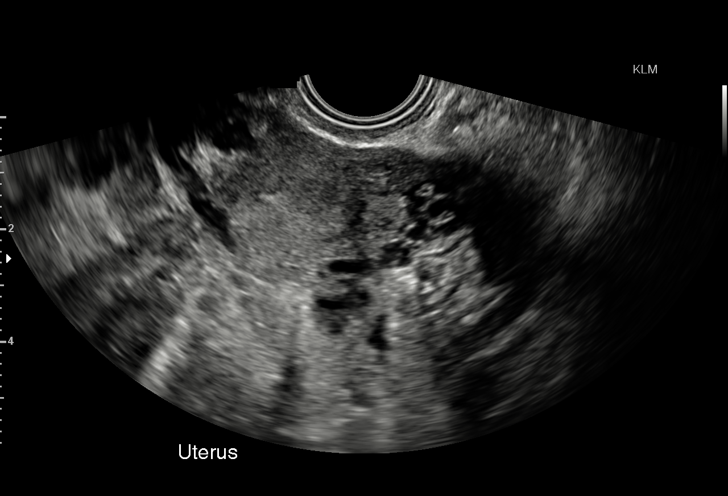
[im 21/40]
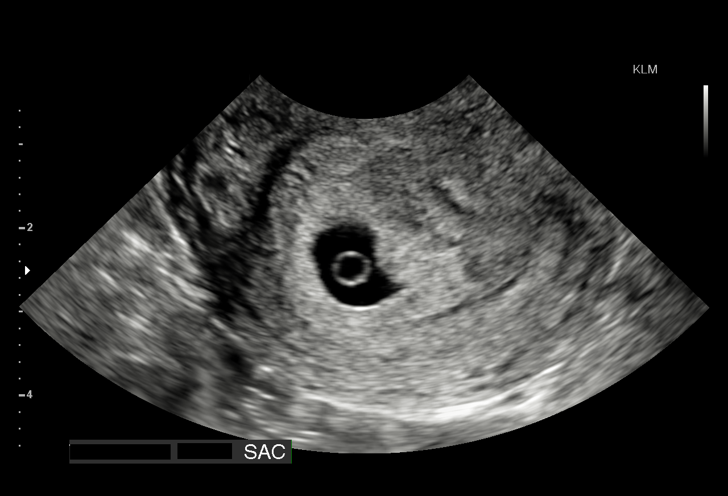
[im 22/40]
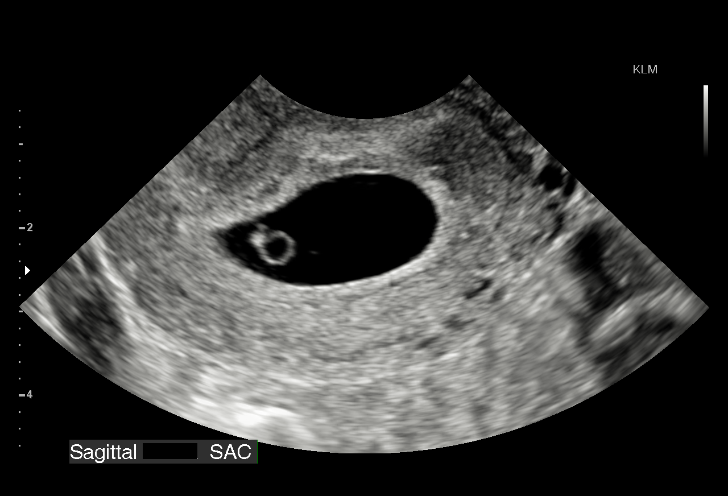
[im 25/40]
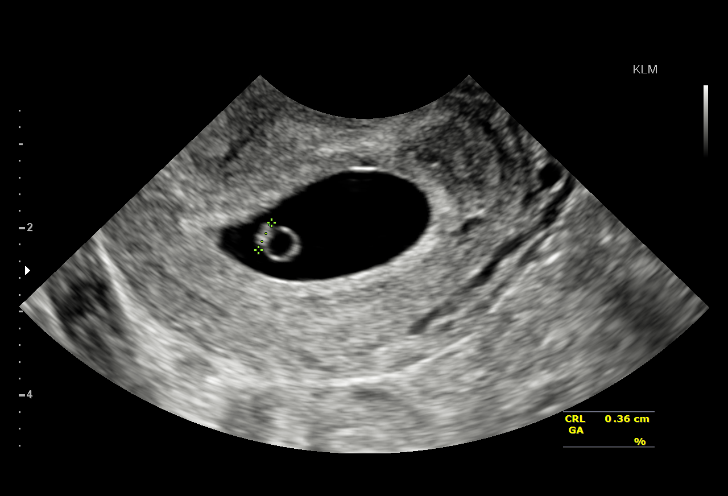
[im 28/40]
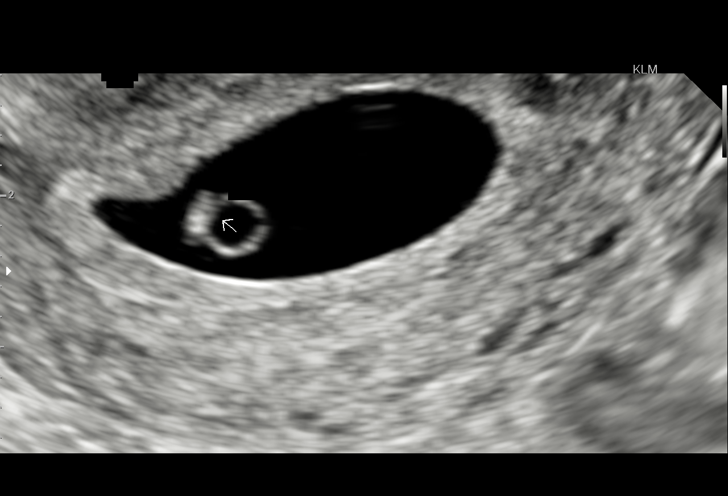
[im 31/40]
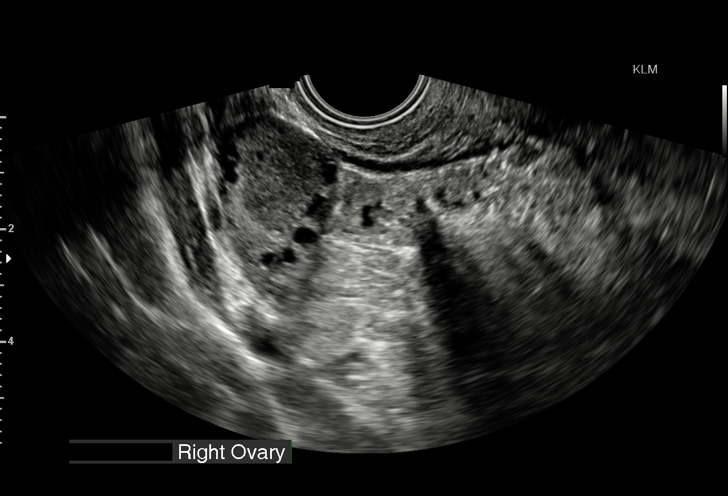
[im 34/40]
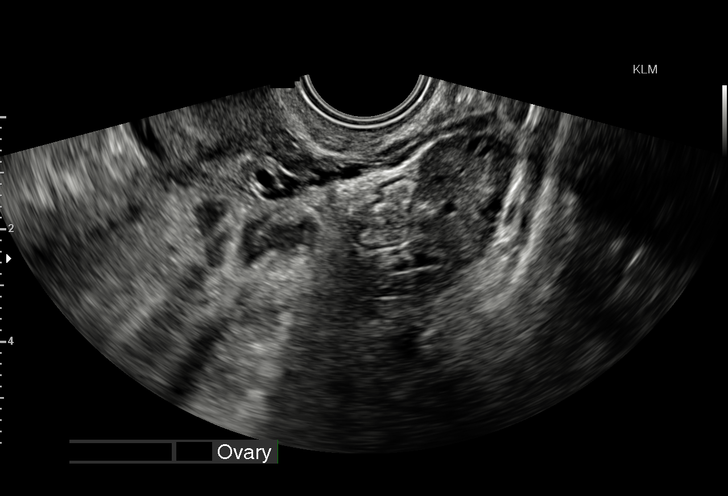
[im 37/40]
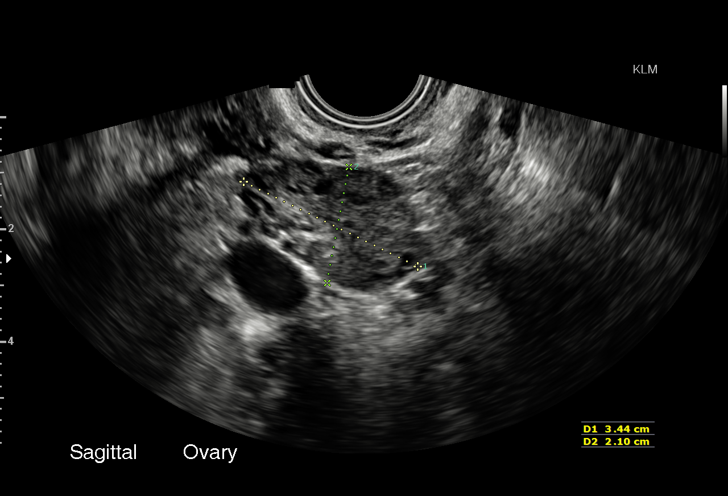
[im 40/40]
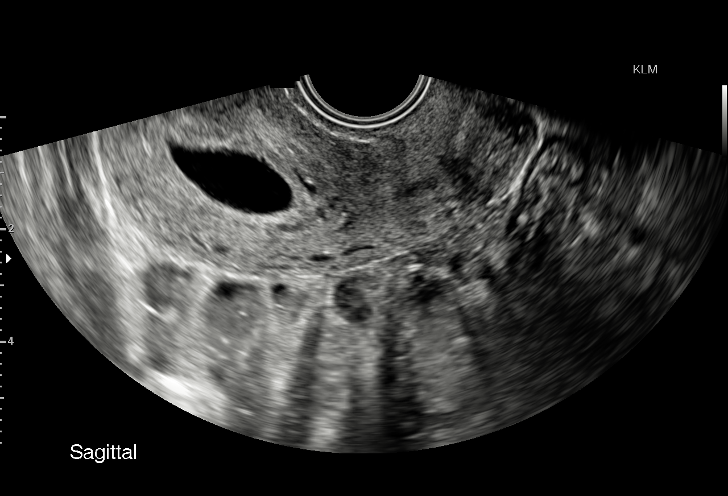

[15 of 28 positions shown; findings below may reference images not displayed]

FINDINGS: Intrauterine gestational sac: Visualized/normal in shape.

Yolk sac:  Yes

Embryo:  Yes

Cardiac Activity: Yes

Heart Rate: 111  bpm

CRL:  3.4  mm   6 weeks w   0 d                  US EDC: 09/06/2016

Subchorionic hemorrhage:  None visualized.

Maternal uterus/adnexae: Unremarkable.  No pelvic free fluid.
IMPRESSION: Single live intrauterine pregnancy with a measured gestational age
of 6 weeks. No evidence of a pregnancy complication.

## 2017-09-04 ENCOUNTER — Emergency Department (HOSPITAL_COMMUNITY)
Admission: EM | Admit: 2017-09-04 | Discharge: 2017-09-05 | Disposition: A | Discharge status: Home / Self Care | Payer: Self-pay | Attending: Emergency Medicine | Admitting: Emergency Medicine

## 2017-09-04 ENCOUNTER — Encounter (HOSPITAL_COMMUNITY): Payer: Self-pay

## 2017-09-04 DIAGNOSIS — N939 Abnormal uterine and vaginal bleeding, unspecified: Secondary | ICD-10-CM | POA: Insufficient documentation

## 2017-09-04 DIAGNOSIS — Z79899 Other long term (current) drug therapy: Secondary | ICD-10-CM | POA: Insufficient documentation

## 2017-09-04 DIAGNOSIS — N760 Acute vaginitis: Secondary | ICD-10-CM | POA: Insufficient documentation

## 2017-09-04 DIAGNOSIS — B9689 Other specified bacterial agents as the cause of diseases classified elsewhere: Secondary | ICD-10-CM | POA: Insufficient documentation

## 2017-09-04 LAB — WET PREP, GENITAL
Sperm: NONE SEEN
TRICH WET PREP: NONE SEEN
Yeast Wet Prep HPF POC: NONE SEEN

## 2017-09-04 LAB — URINALYSIS, ROUTINE W REFLEX MICROSCOPIC
Bilirubin Urine: NEGATIVE
GLUCOSE, UA: NEGATIVE mg/dL
Hgb urine dipstick: NEGATIVE
Ketones, ur: NEGATIVE mg/dL
Leukocytes, UA: NEGATIVE
Nitrite: NEGATIVE
PROTEIN: 30 mg/dL — AB
Specific Gravity, Urine: 1.015 (ref 1.005–1.030)
pH: 8 (ref 5.0–8.0)

## 2017-09-04 LAB — POC URINE PREG, ED: PREG TEST UR: NEGATIVE

## 2017-09-04 MED ORDER — METRONIDAZOLE 500 MG PO TABS: 500.0000 mg | ORAL_TABLET | Freq: Two times a day (BID) | ORAL | 0 refills | Status: DC

## 2017-09-04 NOTE — ED Notes (Signed)
Patient back to ED, able to wait to be seen.

## 2017-09-04 NOTE — ED Triage Notes (Signed)
Pt arrives with complaints of vaginal bleeding x 3 days. PT states she had a period and stopped bleeding for about 1 week and then this started. Pt denies heavy flow, foul odor, clots, or increased pain. NAD VSS

## 2017-09-04 NOTE — ED Notes (Signed)
Patient up to [facility] saying she needs to leave, needs to go to work. Encouraged patient to return if possible

## 2017-09-05 LAB — GC/CHLAMYDIA PROBE AMP (~~LOC~~) NOT AT ARMC
Chlamydia: NEGATIVE
Neisseria Gonorrhea: NEGATIVE

## 2017-09-05 NOTE — ED Provider Notes (Signed)
[facility]-EMERGENCY DEPT Provider Note   CSN: [id number] Arrival date & time: 09/04/17  1705     History   Chief Complaint Chief Complaint  Patient presents with  . Vaginal Bleeding    HPI Betty Avila is a 21 y.o. female.   Vaginal Bleeding  Primary symptoms include vaginal bleeding. There has been no fever. This is a new problem. The current episode started more than 2 days ago. The problem occurs constantly. The problem has not changed since onset.   Past Medical History:  Diagnosis Date  . Medical history non-contributory     Patient Active Problem List   Diagnosis Date Noted  . Vaginal delivery 09/02/2016  . Indication for care or intervention related to labor and delivery 08/31/2016    Past Surgical History:  Procedure Laterality Date  . NO PAST SURGERIES      OB History    Gravida Para Term Preterm AB Living   SAB TAB Ectopic Multiple Live Births         0 1       Home Medications    Prior to Admission medications   Medication Sig Start Date End Date Taking? Authorizing Provider  acetaminophen (TYLENOL) 500 MG tablet Take 1,000 mg by mouth every 6 (six) hours as needed for headache.   Yes [provider]  metroNIDAZOLE (FLAGYL) 500 MG tablet Take 1 tablet (500 mg total) by mouth 2 (two) times daily. One po bid x 7 days 09/04/17   Hartwell Vandiver, Barbara Cower, MD    Family History No family history on file.  Social History Social History  Substance Use Topics  . Smoking status: Never Smoker  . Smokeless tobacco: Never Used     Comment: cigar  . Alcohol use 0.0 oz/week     Comment: occasion     Allergies   Patient has no known allergies.   Review of Systems Review of Systems  Genitourinary: Positive for vaginal bleeding.  All other systems reviewed and are negative.    Physical Exam Updated Vital Signs BP 105/72   Pulse 60   Temp 98.4 F (36.9 C) (Oral)   Resp 16   Ht  (1.702 m)   Wt 56.7 kg (125 lb)    LMP 08/28/2017   SpO2 100%   BMI 19.58 kg/m   Physical Exam  Constitutional: She appears well-developed and well-nourished.  HENT:  Head: Normocephalic and atraumatic.  Eyes: Conjunctivae and EOM are normal.  Neck: Normal range of motion.  Cardiovascular: Normal rate and regular rhythm.   Pulmonary/Chest: No stridor. No respiratory distress.  Abdominal: Soft. She exhibits no distension.  Neurological: She is alert.  Skin: Skin is warm and dry.  Nursing note and vitals reviewed.    ED Treatments / Results  Labs (all labs ordered are listed, but only abnormal results are displayed) Labs Reviewed  WET PREP, GENITAL - Abnormal; Notable for the following:       Result Value   Clue Cells Wet Prep HPF POC PRESENT (*)    WBC, Wet Prep HPF POC MANY (*)    All other components within normal limits  URINALYSIS, ROUTINE W REFLEX MICROSCOPIC - Abnormal; Notable for the following:    Protein, ur 30 (*)    Bacteria, UA RARE (*)    Squamous Epithelial / LPF 0-5 (*)    All other components within normal limits  POC URINE PREG, ED  GC/CHLAMYDIA PROBE AMP ([facility]) NOT AT [facility]    EKG  EKG Interpretation None       Radiology No results found.  Procedures Procedures (including critical care time)  Medications Ordered in ED Medications - No data to display   Initial Impression / Assessment and Plan / ED Course  I have reviewed the triage vital signs and the nursing notes.  Pertinent labs & imaging results that were available during my care of the patient were reviewed by me and considered in my medical decision making (see chart for details).   Vaginal bleeding improved. BV on wet prep. No hemodynamic instability or e/o acute blood loss anemia. Stable for dc.   Final Clinical Impressions(s) / ED Diagnoses   Final diagnoses:  Vaginal bleeding  BV (bacterial vaginosis)    New Prescriptions Discharge Medication List as of 09/04/2017 11:18 PM    START taking these  medications   Details  metroNIDAZOLE (FLAGYL) 500 MG tablet Take 1 tablet (500 mg total) by mouth 2 (two) times daily. One po bid x 7 days, Starting Wed 09/04/2017, Print         Fayez Sturgell, Barbara Cower, MD 09/05/17 [phone]

## 2018-07-25 ENCOUNTER — Encounter (HOSPITAL_COMMUNITY): Payer: Self-pay | Admitting: Emergency Medicine

## 2018-07-25 ENCOUNTER — Ambulatory Visit (HOSPITAL_COMMUNITY)
Admission: EM | Admit: 2018-07-25 | Discharge: 2018-07-25 | Disposition: A | Discharge status: Home / Self Care | Payer: Self-pay | Attending: Internal Medicine | Admitting: Internal Medicine

## 2018-07-25 DIAGNOSIS — F419 Anxiety disorder, unspecified: Secondary | ICD-10-CM | POA: Insufficient documentation

## 2018-07-25 DIAGNOSIS — F411 Generalized anxiety disorder: Secondary | ICD-10-CM

## 2018-07-25 DIAGNOSIS — N898 Other specified noninflammatory disorders of vagina: Secondary | ICD-10-CM | POA: Insufficient documentation

## 2018-07-25 MED ORDER — FLUCONAZOLE 200 MG PO TABS: ORAL_TABLET | ORAL | 0 refills | Status: DC

## 2018-07-25 NOTE — ED Provider Notes (Signed)
[facility] CARE CENTER   [id number] 07/25/18 Arrival Time: 1426   [id number] DISCHARGE  SUBJECTIVE:  Tamaiya Bump is a 22 y.o. female who presents with complaints of vaginal discharge, and vaginal itching that began 2 weeks ago.  Symptoms began after taking antibiotics for UTI.  Last unprotected sexual activity 1 month ago.  Describes discharge as thick/ thin, yellow and "cottage cheese" consistency.  She has NOT tried OTC medications.  She denies aggravating factors.  She reports similar symptoms in the past and was diagnosed with BV and yeast.  She denies fever, chills, nausea, vomiting, abdominal or pelvic pain, urinary symptoms, vaginal odor, vaginal bleeding, dyspareunia, vaginal rashes or lesions.   Patient also mentions hx of anxiety and believes she may have ADD.  States she gets distracted easily and feels this is impacting her life in a negative way.  Does not have PCP.    Patient's last menstrual period was 07/15/2018.  ROS: As per HPI.  Past Medical History:  Diagnosis Date  . Medical history non-contributory    Past Surgical History:  Procedure Laterality Date  . NO PAST SURGERIES     No Known Allergies No current facility-administered medications on file prior to encounter.    Current Outpatient Medications on File Prior to Encounter  Medication Sig Dispense Refill  . acetaminophen (TYLENOL) 500 MG tablet Take 1,000 mg by mouth every 6 (six) hours as needed for headache.      Social History   Socioeconomic History  . Marital status: Single    Spouse name: Not on file  . Number of children: Not on file  . Years of education: Not on file  . Highest education level: Not on file  Occupational History  . Not on file  Social Needs  . Financial resource strain: Not on file  . Food insecurity:    Worry: Not on file    Inability: Not on file  . Transportation needs:    Medical: Not on file    Non-medical: Not on file  Tobacco Use  . Smoking status:  Never Smoker  . Smokeless tobacco: Never Used  . Tobacco comment: cigar  Substance and Sexual Activity  . Alcohol use: Yes    Alcohol/week: 0.0 standard drinks    Comment: occasion  . Drug use: No  . Sexual activity: Yes    Birth control/protection: None    Comment: last had intercourse on 04/10/16  Lifestyle  . Physical activity:    Days per week: Not on file    Minutes per session: Not on file  . Stress: Not on file  Relationships  . Social connections:    Talks on phone: Not on file    Gets together: Not on file    Attends religious service: Not on file    Active member of club or organization: Not on file    Attends meetings of clubs or organizations: Not on file    Relationship status: Not on file  . Intimate partner violence:    Fear of current or ex partner: Not on file    Emotionally abused: Not on file    Physically abused: Not on file    Forced sexual activity: Not on file  Other Topics Concern  . Not on file  Social History Narrative  . Not on file   No family history on file.  OBJECTIVE:  Vitals:   07/25/18 1441  BP: 136/81  Pulse: 71  Resp: 16  Temp: (!) 97.5 F (36.4 C)  SpO2: 98%     General appearance: alert, cooperative, appears stated age and no distress Throat: lips, mucosa, and tongue normal; teeth and gums normal Lungs: CTA bilaterally without adventitious breath sounds Heart: regular rate and rhythm.  Radial pulses 2+ symmetrical bilaterally Abdomen: soft, non-tender; bowel sounds normal; no guarding GU: Self vaginal swab obtained Skin: warm and dry Psychological:  Alert and cooperative. Normal mood and affect.  ASSESSMENT & PLAN:  1. Vaginal discharge     Meds ordered this encounter  Medications  . fluconazole (DIFLUCAN) 200 MG tablet    Sig: Take one dose by mouth, wait 72 hours, and then take second dose by mouth    Dispense:  2 tablet    Refill:  0    Order Specific Question:   Supervising Provider    Answer:   Isa RankinMURRAY,  LAURA WILSON [[id number]]    Pending: Labs Reviewed  CERVICOVAGINAL ANCILLARY ONLY    Vaginal self-swab obtained.  Prescribed diflucan 200 mg once daily and then second dose 72 hours later Take medications as prescribed and to completion We will follow up with you regarding the results of your test If tests are positive, please abstain from sexual activity for at least 7 days and notify partners PCP assistance initiated for you Make an appointment with PCP or Community health and wellness for further evaluation and management of anxiety and/or if symptoms persists Return here or go to ER if you have any new or worsening symptoms    Reviewed expectations re: course of current medical issues. Questions answered. Outlined signs and symptoms indicating need for more acute intervention. Patient verbalized understanding. After Visit Summary given.       Rennis HardingWurst, Ayush Boulet, PA-C 07/25/18 1511

## 2018-07-25 NOTE — ED Triage Notes (Signed)
Pt states two weeks ago she was given antibiotics for a UTI and she thinks it gave her a yeast infection. C/o vaginal discharge.

## 2018-07-25 NOTE — Discharge Instructions (Addendum)
Vaginal self-swab obtained.  Prescribed diflucan 200 mg once daily and then second dose 72 hours later Take medications as prescribed and to completion We will follow up with you regarding the results of your test If tests are positive, please abstain from sexual activity for at least 7 days and notify partners PCP assistance initiated for you Make an appointment with PCP or Community health and wellness for further evaluation and management of anxiety and/or if symptoms persists Return here or go to ER if you have any new or worsening symptoms

## 2018-07-29 LAB — CERVICOVAGINAL ANCILLARY ONLY
BACTERIAL VAGINITIS: NEGATIVE
Candida vaginitis: NEGATIVE
Chlamydia: NEGATIVE
Neisseria Gonorrhea: NEGATIVE
Trichomonas: NEGATIVE

## 2018-09-01 ENCOUNTER — Encounter (HOSPITAL_COMMUNITY): Payer: Self-pay | Admitting: Emergency Medicine

## 2018-09-01 ENCOUNTER — Emergency Department (HOSPITAL_COMMUNITY)
Admission: EM | Admit: 2018-09-01 | Discharge: 2018-09-02 | Disposition: A | Discharge status: Home / Self Care | Payer: Self-pay | Attending: Emergency Medicine | Admitting: Emergency Medicine

## 2018-09-01 ENCOUNTER — Other Ambulatory Visit: Payer: Self-pay

## 2018-09-01 DIAGNOSIS — L040 Acute lymphadenitis of face, head and neck: Secondary | ICD-10-CM | POA: Insufficient documentation

## 2018-09-01 DIAGNOSIS — I889 Nonspecific lymphadenitis, unspecified: Secondary | ICD-10-CM

## 2018-09-01 LAB — GROUP A STREP BY PCR: GROUP A STREP BY PCR: NOT DETECTED

## 2018-09-01 MED ORDER — AZITHROMYCIN 250 MG PO TABS: ORAL_TABLET | ORAL | 0 refills | Status: DC

## 2018-09-01 MED ORDER — IBUPROFEN 800 MG PO TABS
800.0000 mg | ORAL_TABLET | Freq: Once | ORAL | Status: AC
  Administered 2018-09-01: 800 mg via ORAL
  Filled 2018-09-01: qty 1

## 2018-09-01 MED ORDER — ACETAMINOPHEN 500 MG PO TABS
1000.0000 mg | ORAL_TABLET | Freq: Once | ORAL | Status: AC
  Administered 2018-09-01: 1000 mg via ORAL
  Filled 2018-09-01: qty 2

## 2018-09-01 NOTE — ED Notes (Signed)
Patient verbalizes understanding of discharge instructions. Opportunity for questioning and answers were provided. Armband removed by staff, pt discharged from ED ambulatory.   

## 2018-09-01 NOTE — ED Provider Notes (Addendum)
[facility] EMERGENCY DEPARTMENT Provider Note   CSN: [id number] Arrival date & time: 09/01/18  2205     History   Chief Complaint Chief Complaint  Patient presents with  . Facial Swelling    HPI Betty Avila is a 22 y.o. female.  The history is provided by the patient.  Illness  This is a new problem. The current episode started more than 2 days ago. The problem occurs constantly. The problem has not changed since onset.Pertinent negatives include no chest pain, no abdominal pain, no headaches and no shortness of breath. Nothing aggravates the symptoms. Nothing relieves the symptoms. She has tried nothing for the symptoms. The treatment provided no relief.  Patient with no significant PMH presents with an isolated L anterior, superficial cervical lymph node swelling and pain.  No f/c/r.  Did have a wisdom tooth that was bothering her approximately 2 weeks ago.  No cough, no fevers no sore throat.  No associated swelling.  No night sweats no weight loss.    Past Medical History:  Diagnosis Date  . Medical history non-contributory     Patient Active Problem List   Diagnosis Date Noted  . Vaginal delivery 09/02/2016  . Indication for care or intervention related to labor and delivery 08/31/2016    Past Surgical History:  Procedure Laterality Date  . NO PAST SURGERIES       OB History    Gravida  1   Para  1   Term  1   Preterm      AB      Living  1     SAB      TAB      Ectopic      Multiple  0   Live Births  1            Home Medications    Prior to Admission medications   Medication Sig Start Date End Date Taking? Authorizing Provider  acetaminophen (TYLENOL) 500 MG tablet Take 1,000 mg by mouth every 6 (six) hours as needed for headache.    [provider]  fluconazole (DIFLUCAN) 200 MG tablet Take one dose by mouth, wait 72 hours, and then take second dose by mouth 07/25/18   Wurst, [place], PA-C     Family History History reviewed. No pertinent family history.  Social History Social History   Tobacco Use  . Smoking status: Never Smoker  . Smokeless tobacco: Never Used  . Tobacco comment: cigar  Substance Use Topics  . Alcohol use: Yes    Alcohol/week: 0.0 standard drinks    Comment: occasion  . Drug use: No     Allergies   Patient has no known allergies.   Review of Systems Review of Systems  HENT: Negative for congestion, drooling, ear pain, sore throat, trouble swallowing and voice change.   Eyes: Negative for photophobia.  Respiratory: Negative for chest tightness and shortness of breath.   Cardiovascular: Negative for chest pain, palpitations and leg swelling.  Gastrointestinal: Negative for abdominal pain.  Neurological: Negative for headaches.  Hematological: Does not bruise/bleed easily.  All other systems reviewed and are negative.    Physical Exam Updated Vital Signs BP 136/87 (BP Location: Right Arm)   Pulse (!) 107   Temp 98.4 F (36.9 C) (Oral)   Resp 16   LMP 08/15/2018 (Exact Date)   SpO2 100%   Physical Exam  Constitutional: She is oriented to person, place, and time. She appears well-developed and  well-nourished. No distress.  HENT:  Head: Normocephalic and atraumatic.  Right Ear: External ear normal.  Left Ear: External ear normal.  Nose: Nose normal.  Mouth/Throat: Oropharynx is clear and moist. No oropharyngeal exudate.  Eyes: Pupils are equal, round, and reactive to light. Conjunctivae are normal.  Neck: Normal range of motion.  Isolated left anterior superficial lymph node, 9 mm no matted down, freely mobile   Cardiovascular: Normal rate, regular rhythm, normal heart sounds and intact distal pulses.  Pulmonary/Chest: Effort normal and breath sounds normal. No stridor. She has no wheezes. She has no rales.  Abdominal: Soft. Bowel sounds are normal. She exhibits no mass. There is no tenderness. There is no rebound and no  guarding.  Musculoskeletal: Normal range of motion.  Lymphadenopathy:       Head (right side): No submental, no submandibular, no tonsillar, no preauricular, no posterior auricular and no occipital adenopathy present.       Head (left side): No submental, no submandibular, no tonsillar, no preauricular, no posterior auricular and no occipital adenopathy present.    She has cervical adenopathy.       Right cervical: No superficial cervical, no deep cervical and no posterior cervical adenopathy present.      Left cervical: Superficial cervical adenopathy present. No deep cervical and no posterior cervical adenopathy present.       Right axillary: No pectoral and no lateral adenopathy present.       Left axillary: No pectoral and no lateral adenopathy present.      Right: No supraclavicular and no epitrochlear adenopathy present.       Left: No supraclavicular and no epitrochlear adenopathy present.  Neurological: She is alert and oriented to person, place, and time.  Skin: Skin is warm and dry. Capillary refill takes less than 2 seconds.  Psychiatric: She has a normal mood and affect.     ED Treatments / Results  Labs (all labs ordered are listed, but only abnormal results are displayed) Results for orders placed or performed during the hospital encounter of 09/01/18  Group A Strep by PCR  Result Value Ref Range   Group A Strep by PCR NOT DETECTED NOT DETECTED   No results found.  Radiology No results found.  Procedures Procedures (including critical care time)  Medications Ordered in ED Medications  ibuprofen (ADVIL,MOTRIN) tablet 800 mg (has no administration in time range)  acetaminophen (TYLENOL) tablet 1,000 mg (has no administration in time range)    I solated lymph node in the cervical chain present for 1 week.  No associated LAN, No class B symptom to indicate lymphoma.  No strep.  No historical symptoms of mono.  I suspect this is reactive likely from a dental source.      Final Clinical Impressions(s) / ED Diagnoses   Return for weakness, numbness, changes in vision or speech, fevers >100.4 unrelieved by medication, shortness of breath, intractable vomiting, or diarrhea, abdominal pain, Inability to tolerate liquids or food, cough, altered mental status or any concerns. No signs of systemic illness or infection. The patient is nontoxic-appearing on exam and vital signs are within normal limits.    I have reviewed the triage vital signs and the nursing notes. Pertinent labs &imaging results that were available during my care of the patient were reviewed by me and considered in my medical decision making (see chart for details).  After history, exam, and medical workup I feel the patient has been appropriately medically screened and is safe for  discharge home. Pertinent diagnoses were discussed with the patient. Patient was given return precautions.   Tania Steinhauser, MD 09/01/18 [id number]    Cy Blamer, MD 09/02/18 1478

## 2018-09-01 NOTE — ED Triage Notes (Signed)
Patient here with lymph node swelling around her neck.  Patient states it has been going on for a few days.

## 2019-01-27 ENCOUNTER — Encounter (HOSPITAL_COMMUNITY): Payer: Self-pay | Admitting: Emergency Medicine

## 2019-01-27 ENCOUNTER — Ambulatory Visit (HOSPITAL_COMMUNITY)
Admission: EM | Admit: 2019-01-27 | Discharge: 2019-01-27 | Disposition: A | Discharge status: Home / Self Care | Payer: Self-pay | Attending: Family Medicine | Admitting: Family Medicine

## 2019-01-27 ENCOUNTER — Other Ambulatory Visit: Payer: Self-pay

## 2019-01-27 DIAGNOSIS — B9689 Other specified bacterial agents as the cause of diseases classified elsewhere: Secondary | ICD-10-CM | POA: Insufficient documentation

## 2019-01-27 DIAGNOSIS — N760 Acute vaginitis: Secondary | ICD-10-CM | POA: Insufficient documentation

## 2019-01-27 LAB — POCT PREGNANCY, URINE: PREG TEST UR: NEGATIVE

## 2019-01-27 MED ORDER — METRONIDAZOLE 500 MG PO TABS: 500.0000 mg | ORAL_TABLET | Freq: Two times a day (BID) | ORAL | 0 refills | Status: DC

## 2019-01-27 MED ORDER — FLUCONAZOLE 150 MG PO TABS: 150.0000 mg | ORAL_TABLET | Freq: Every day | ORAL | 0 refills | Status: DC

## 2019-01-27 NOTE — Discharge Instructions (Signed)
I am treating you for vaginal infection.  You will take both Diflucan and metronidazole as directed.  Avoid sexual relations until you have been treated for 1 week. Testing has also been done for chlamydia, gonorrhea, and trichomonas.  We will notify you if any of these tests come back positive We did lab testing during this visit.  If there are any abnormal findings that require change in medicine or indicate a positive result, you will be notified.  If all of your tests are normal, you will not be called.

## 2019-01-27 NOTE — ED Triage Notes (Signed)
Pt complains of vaginal discharge and odor x1 week.  Pt would like STD testing.

## 2019-01-27 NOTE — ED Provider Notes (Signed)
[facility]    CSN: [id number] Arrival date & time: 01/27/19  1520     History   Chief Complaint Chief Complaint  Patient presents with  . Vaginal Discharge  . vaginal odor    HPI Betty Avila is a 23 y.o. female.   HPI  Patient is here for vaginal infection.  She states it is very malodorous.  Minor vaginal itching.  She has had BV before.  She believes she has a BV infection. She states that she would also like to be tested for STDs.  She recently got back together with the father of her child, and wants to make sure that she does not have anything after their separation. She is not having any abdominal pain, fever, rash, or signs of infection other than the vaginal discharge and odor. No urinary frequency or dysuria.  Past Medical History:  Diagnosis Date  . Medical history non-contributory     Patient Active Problem List   Diagnosis Date Noted  . Vaginal delivery 09/02/2016  . Indication for care or intervention related to labor and delivery 08/31/2016    Past Surgical History:  Procedure Laterality Date  . NO PAST SURGERIES      OB History    Gravida  1   Para  1   Term  1   Preterm      AB      Living  1     SAB      TAB      Ectopic      Multiple  0   Live Births  1            Home Medications    Prior to Admission medications   Medication Sig Start Date End Date Taking? Authorizing Provider  acetaminophen (TYLENOL) 500 MG tablet Take 1,000 mg by mouth every 6 (six) hours as needed for headache.    [provider]  fluconazole (DIFLUCAN) 150 MG tablet Take 1 tablet (150 mg total) by mouth daily. Repeat in 1 week if needed 01/27/19   Eustace Moore, MD  metroNIDAZOLE (FLAGYL) 500 MG tablet Take 1 tablet (500 mg total) by mouth 2 (two) times daily. 01/27/19   Eustace Moore, MD    Family History History reviewed. No pertinent family history.  Social History Social History   Tobacco Use  .  Smoking status: Never Smoker  . Smokeless tobacco: Never Used  . Tobacco comment: cigar  Substance Use Topics  . Alcohol use: Yes    Alcohol/week: 0.0 standard drinks    Comment: occasion  . Drug use: No     Allergies   Patient has no known allergies.   Review of Systems Review of Systems  Constitutional: Negative for chills and fever.  HENT: Negative for ear pain and sore throat.   Eyes: Negative for pain and visual disturbance.  Respiratory: Negative for cough and shortness of breath.   Cardiovascular: Negative for chest pain and palpitations.  Gastrointestinal: Negative for abdominal pain and vomiting.  Genitourinary: Positive for vaginal discharge. Negative for dysuria and hematuria.  Musculoskeletal: Negative for arthralgias and back pain.  Skin: Negative for color change and rash.  Neurological: Negative for seizures and syncope.  All other systems reviewed and are negative.    Physical Exam Triage Vital Signs ED Triage Vitals [01/27/19 1533]  Enc Vitals Group     BP 98/80     Pulse Rate 70     Resp 12  Temp 98.4 F (36.9 C)     Temp Source Oral     SpO2 100 %   No data found.  Updated Vital Signs BP 98/80 (BP Location: Right Arm)   Pulse 70   Temp 98.4 F (36.9 C) (Oral)   Resp 12   LMP 01/20/2019 (Approximate)   SpO2 100%      Physical Exam Constitutional:      General: She is not in acute distress.    Appearance: She is well-developed.  HENT:     Head: Normocephalic and atraumatic.  Eyes:     Conjunctiva/sclera: Conjunctivae normal.     Pupils: Pupils are equal, round, and reactive to light.  Neck:     Musculoskeletal: Normal range of motion.  Cardiovascular:     Rate and Rhythm: Normal rate.  Pulmonary:     Effort: Pulmonary effort is normal. No respiratory distress.  Abdominal:     General: There is no distension.     Palpations: Abdomen is soft.  Genitourinary:    Comments: Exam declined by patient.  No rash or  lesions Musculoskeletal: Normal range of motion.  Skin:    General: Skin is warm and dry.  Neurological:     Mental Status: She is alert.      UC Treatments / Results  Labs (all labs ordered are listed, but only abnormal results are displayed) Labs Reviewed  CERVICOVAGINAL ANCILLARY ONLY    EKG None  Radiology No results found.  Procedures Procedures (including critical care time)  Medications Ordered in UC Medications - No data to display  Initial Impression / Assessment and Plan / UC Course  I have reviewed the triage vital signs and the nursing notes.  Pertinent labs & imaging results that were available during my care of the patient were reviewed by me and considered in my medical decision making (see chart for details).     Reminded patient about safe sex and preventing infections in pregnancy Final Clinical Impressions(s) / UC Diagnoses   Final diagnoses:  Bacterial vaginosis     Discharge Instructions     I am treating you for vaginal infection.  You will take both Diflucan and metronidazole as directed.  Avoid sexual relations until you have been treated for 1 week. Testing has also been done for chlamydia, gonorrhea, and trichomonas.  We will notify you if any of these tests come back positive We did lab testing during this visit.  If there are any abnormal findings that require change in medicine or indicate a positive result, you will be notified.  If all of your tests are normal, you will not be called.      ED Prescriptions    Medication Sig Dispense Auth. Provider   fluconazole (DIFLUCAN) 150 MG tablet Take 1 tablet (150 mg total) by mouth daily. Repeat in 1 week if needed 2 tablet Eustace Moore, MD   metroNIDAZOLE (FLAGYL) 500 MG tablet Take 1 tablet (500 mg total) by mouth 2 (two) times daily. 14 tablet Eustace Moore, MD     Controlled Substance Prescriptions [place] Controlled Substance Registry consulted? Not Applicable   Eustace Moore, MD 01/27/19 [phone]

## 2019-01-29 LAB — CERVICOVAGINAL ANCILLARY ONLY
CHLAMYDIA, DNA PROBE: NEGATIVE
NEISSERIA GONORRHEA: NEGATIVE
TRICH (WINDOWPATH): NEGATIVE

## 2019-11-27 NOTE — L&D Delivery Note (Addendum)
Delivery Note At 11:10 AM a viable and healthy female was delivered via Vaginal, Spontaneous (Presentation: Right Occiput Anterior).  APGAR: 9, 9; weight 9 lb 1.2 oz (4115 g).   Placenta status: Spontaneous, Intact.  Cord: 3 vessels with the following complications: None.    Anesthesia: Epidural Episiotomy: None Lacerations: 1st degree;Perineal Suture Repair: 3.0 vicryl rapide Est. Blood Loss (mL): 400  Mom to postpartum.  Baby to Couplet care / Skin to Skin.  Betty Avila is a 24 y.o. female [id number] with IUP at [redacted]w[redacted]d admitted for spontaneous labor.  She progressed without augmentation to complete and pushed less than 30 minutes to deliver.  Cord clamping delayed by 1+ minutes then clamped by CNM and cut by FOB.  Placenta intact and spontaneous, bleeding minimal. First degree perineal laceration repaired without difficulty, bilateral small labial lacerations, right labial laceration repaired. Left hemostatic and not repaired..  Mom and baby stable prior to transfer to postpartum. She plans on breastfeeding. She requests POPs for birth control.    Betty Avila 07/10/2020, 1:03 PM

## 2019-12-22 ENCOUNTER — Telehealth (INDEPENDENT_AMBULATORY_CARE_PROVIDER_SITE_OTHER): Payer: Self-pay | Admitting: Lactation Services

## 2019-12-22 DIAGNOSIS — Z3200 Encounter for pregnancy test, result unknown: Secondary | ICD-10-CM

## 2019-12-22 NOTE — Telephone Encounter (Signed)
Pt called in wanting to establish care. She reports she is pregnant with a LMP of 10/03/2019. Message to front office to get pt schedule for nurse visit for confirmation of pregnancy.   Pt reports she is taking PNV.

## 2020-01-04 ENCOUNTER — Ambulatory Visit: Payer: Self-pay

## 2020-01-06 ENCOUNTER — Ambulatory Visit (INDEPENDENT_AMBULATORY_CARE_PROVIDER_SITE_OTHER): Payer: Self-pay | Admitting: *Deleted

## 2020-01-06 ENCOUNTER — Other Ambulatory Visit: Payer: Self-pay

## 2020-01-06 ENCOUNTER — Encounter: Payer: Self-pay | Admitting: Family Medicine

## 2020-01-06 DIAGNOSIS — Z3200 Encounter for pregnancy test, result unknown: Secondary | ICD-10-CM

## 2020-01-06 LAB — POCT PREGNANCY, URINE: Preg Test, Ur: POSITIVE — AB

## 2020-01-06 NOTE — Progress Notes (Signed)
Pt informed of +UPT. LMP 09/01/19, yielding EDD 06/07/20.  Pt plans to schedule prenatal care @ [facility]. She reports having some nausea - dietary recommendations discussed. Pt advised to begin prenatal vitamins and schedule prenatal care ASAP. She voiced understanding.

## 2020-01-07 NOTE — Progress Notes (Signed)
Patient seen and assessed by nursing staff during this encounter. I have reviewed the chart and agree with the documentation and plan.  Jaynie Collins, MD 01/07/2020 7:56 AM

## 2020-02-01 NOTE — Progress Notes (Signed)
Record abstracted per 02/01/20 phone interview with Hal Hope, RN; Sharlette Dense, RN

## 2020-02-02 ENCOUNTER — Ambulatory Visit: Payer: Self-pay | Admitting: Family Medicine

## 2020-02-02 ENCOUNTER — Encounter: Payer: Self-pay | Admitting: Family Medicine

## 2020-02-02 ENCOUNTER — Other Ambulatory Visit: Payer: Self-pay | Admitting: Family Medicine

## 2020-02-02 ENCOUNTER — Other Ambulatory Visit: Payer: Self-pay

## 2020-02-02 VITALS — BP 100/63 | HR 62 | Temp 97.8°F | Wt 145.0 lb

## 2020-02-02 DIAGNOSIS — Z3482 Encounter for supervision of other normal pregnancy, second trimester: Secondary | ICD-10-CM

## 2020-02-02 DIAGNOSIS — O0930 Supervision of pregnancy with insufficient antenatal care, unspecified trimester: Secondary | ICD-10-CM | POA: Insufficient documentation

## 2020-02-02 DIAGNOSIS — R112 Nausea with vomiting, unspecified: Secondary | ICD-10-CM

## 2020-02-02 DIAGNOSIS — Z3480 Encounter for supervision of other normal pregnancy, unspecified trimester: Secondary | ICD-10-CM

## 2020-02-02 LAB — URINALYSIS
Bilirubin, UA: NEGATIVE
Glucose, UA: NEGATIVE
Ketones, UA: NEGATIVE
Leukocytes,UA: NEGATIVE
Nitrite, UA: NEGATIVE
Protein,UA: NEGATIVE
RBC, UA: NEGATIVE
Specific Gravity, UA: 1.02 (ref 1.005–1.030)
Urobilinogen, Ur: 0.2 mg/dL (ref 0.2–1.0)
pH, UA: 7.5 (ref 5.0–7.5)

## 2020-02-02 LAB — WET PREP FOR TRICH, YEAST, CLUE
Trichomonas Exam: NEGATIVE
Yeast Exam: NEGATIVE

## 2020-02-02 LAB — OB RESULTS CONSOLE HIV ANTIBODY (ROUTINE TESTING): HIV: NONREACTIVE

## 2020-02-02 LAB — HEMOGLOBIN, FINGERSTICK: Hemoglobin: 11.9 g/dL (ref 11.1–15.9)

## 2020-02-02 LAB — OB RESULTS CONSOLE RPR: RPR: NONREACTIVE

## 2020-02-02 LAB — OB RESULTS CONSOLE GC/CHLAMYDIA
Chlamydia: NEGATIVE
Gonorrhea: NEGATIVE

## 2020-02-02 LAB — OB RESULTS CONSOLE HEPATITIS B SURFACE ANTIGEN: Hepatitis B Surface Ag: NEGATIVE

## 2020-02-02 MED ORDER — PROMETHAZINE HCL 25 MG PO TABS: ORAL_TABLET | ORAL | 0 refills | Status: DC

## 2020-02-02 NOTE — Progress Notes (Addendum)
Patient here for new OB at about 22 weeks, although she is unsure about LMP. She states she was bigger with 1st child at 22 weeks than she is now. Here with partner (fiance) Barron Schmid, and would like him to come in for heart beat.Burt Knack, RN

## 2020-02-02 NOTE — Progress Notes (Signed)
[place] DE[facility] [place] [id number] [phone]  INITIAL PRENATAL VISIT NOTE  Subjective:  Betty Avila is a 24 y.o. G2P1001 at [redacted]w[redacted]d being seen today to start prenatal care at the [facility].  She is currently monitored for the following issues for this low-risk pregnancy and has Supervision of other normal pregnancy, antepartum and Late prenatal care on their problem list.  Pt is here for initial OB visit. She is feeling happy about this pregnancy. She lives with her husband, grandmother and 54 yo daughter. She is not currently working outside the home. Her husband works in trucking. Physically she feels tired, having lots of n/v - vomits 2-3 x/day. She has had frequent HA, has been taking excedrin. She has no chronic medical issues and takes no daily medication aside from PNV. She drank alcohol occasionally prior to but none during this pregnancy. She denies tobacco or substance use. Her prior pregnancy resulted in SVD at 39 wks with infant weight of 7+lbs. She believes she may have had elevated blood pressure during delivery, but I was unable to see records of high BP diagnosis from the hospital delivery note from that date found in [place]. She is unsure of dates of this current pregnancy. Her phq-9 results are 3, states she feels tired, declines beh health referral.    Contractions: Not present. Vag. Bleeding: None.  Movement: Absent. Denies leaking of fluid.   Indications for ASA therapy (per uptodate) One of the following: Previous pregnancy with preeclampsia, especially early onset and with an adverse outcome No Multifetal gestation No Chronic hypertension No Type 1 or 2 diabetes mellitus No Chronic kidney disease No Autoimmune disease (antiphospholipid syndrome, systemic lupus erythematosus) No  Two or more of the following: Nulliparity No Obesity (body mass index >30  kg/m2) No Family history of preeclampsia in mother or sister No Age ?35 years No Sociodemographic characteristics (African American race, low socioeconomic level) Yes Personal risk factors (eg, previous pregnancy with low birth weight or small for gestational age infant, previous adverse pregnancy outcome [eg, stillbirth], interval >10 years between pregnancies) No   The following portions of the patient's history were reviewed and updated as appropriate: allergies, current medications, past family history, past medical history, past social history, past surgical history and problem list. Problem list updated.  Objective:   Vitals:   02/02/20 0915  BP: 100/63  Pulse: 62  Temp: 97.8 F (36.6 C)  Weight: 145 lb (65.8 kg)    Fetal Status: Fetal Heart Rate (bpm): 145 Fundal Height: 20 cm Movement: Absent  Presentation: Undeterminable   Physical Exam Vitals and nursing note reviewed.  Constitutional:      General: She is not in acute distress.    Appearance: Normal appearance. She is well-developed.  HENT:     Head: Normocephalic and atraumatic.     Right Ear: External ear normal.     Left Ear: External ear normal.     Nose: Nose normal. No congestion or rhinorrhea.     Mouth/Throat:     Lips: Pink.     Mouth: Mucous membranes are moist.     Dentition: Normal dentition. No dental caries.     Pharynx: Oropharynx is clear. Uvula midline.  Eyes:     General: No scleral icterus.    Conjunctiva/sclera: Conjunctivae normal.  Neck:     Thyroid: No thyroid mass or thyromegaly.  Cardiovascular:     Rate and Rhythm:  Normal rate.     Pulses: Normal pulses.     Comments: Extremities are warm and well perfused Pulmonary:     Effort: Pulmonary effort is normal.     Breath sounds: Normal breath sounds.  Chest:     Breasts: Breasts are symmetrical.        Right: Normal. No mass, nipple discharge or skin change.        Left: Normal. No mass, nipple discharge or skin change.   Abdominal:     General: Abdomen is flat.     Palpations: Abdomen is soft.     Tenderness: There is no abdominal tenderness.     Comments: Gravid   Genitourinary:    General: Normal vulva.     Exam position: Lithotomy position.     Pubic Area: No rash.      Labia:        Right: No rash.        Left: No rash.      Vagina: Vaginal discharge (moderate amt, white) present.     Cervix: Friability present. No cervical motion tenderness.     Uterus: Normal. Enlarged (Gravid 20 wk size ). Not tender.      Adnexa: Right adnexa normal and left adnexa normal.     Rectum: Normal. No external hemorrhoid.  Musculoskeletal:     Right lower leg: No edema.     Left lower leg: No edema.  Lymphadenopathy:     Upper Body:     Right upper body: No axillary adenopathy.     Left upper body: No axillary adenopathy.  Skin:    General: Skin is warm.     Capillary Refill: Capillary refill takes less than 2 seconds.     Comments: + multiple tattoos  Neurological:     Mental Status: She is alert.     Assessment and Plan:  Pregnancy: G2P1001 at [redacted]w[redacted]d   1. Supervision of other normal pregnancy, antepartum -Initial prenatal visit today.  -Pt declines quad genetic screening. -Anatomy referral placed today.  -Advised to STOP taking excedrin for HA. Advised tylenol, plenty of liquids, hydration and treating n/v as below. -Pap obtained today, she is unsure if she's had this before. - QuantiFERON-TB Gold Plus - Chlamydia/GC NAA, Confirmation - Prenatal profile without Varicella or Rubella - HIV  LAB - HGB FRAC. W/SOLUBILITY - Urine Culture - WET PREP FOR TRICH, YEAST, CLUE - Hemoglobin, venipuncture - Urinalysis (Urine Dip) - Pap IG (Image Guided)  2. Late prenatal care -She had earlier appts but had to cancel, hard to reschedule. She lives in [place] and hopes to transfer care there eventually.   3. Nausea and vomiting, intractability of vomiting not specified, unspecified vomiting  type -Persistant nausea, vomiting twice daily. Discussed dietary suggestions including small, frequent meals with protein and OTC remedies including ginger, SeaBands and vitamin B6. Offered rx antiemetic, pt accepts. Advised to let us know if sx persist or worsen.  -The patient was dispensed promethazine today. I provided counseling today regarding the medication. We discussed the medication, the side effects and when to call clinic. Patient given the opportunity to ask questions. Questions answered.  - promethazine (PHENERGAN) 25 MG tablet; Take 1/2 - 1 tab every 4-6 hrs for nausea and vomiting.  Dispense: 15 tablet; Refill: 0    Discussed overview of care and coordination with inpatient delivery practices including [facility], [facility], [facility] and [facility].    Preterm labor symptoms and general obstetric precautions including but not  limited to vaginal bleeding, contractions, leaking of fluid and fetal movement were reviewed in detail with the patient.  Please refer to After Visit Summary for other counseling recommendations.   Return in about 4 weeks (around 03/01/2020) for routine prenatal care.  Future Appointments  Date Time Provider Department Center  03/01/2020 11:00 AM [facility] PROVIDER [facility] None    Ann Held, PA-C

## 2020-02-02 NOTE — Progress Notes (Signed)
Patient declined flu vaccine and quad screen today. Quad screen declination signed. Wet mount and urine dip reviewed, no treatment indicated. U/S referral faxed to [facility], appointment pending. Patient to schedule 4 week appointment for MH RV.Marland KitchenBurt Knack, RN

## 2020-02-03 DIAGNOSIS — Z6791 Unspecified blood type, Rh negative: Secondary | ICD-10-CM | POA: Insufficient documentation

## 2020-02-03 DIAGNOSIS — O26892 Other specified pregnancy related conditions, second trimester: Secondary | ICD-10-CM | POA: Insufficient documentation

## 2020-02-03 LAB — CBC/D/PLT+RPR+RH+ABO+AB SCR
Antibody Screen: NEGATIVE
Basophils Absolute: 0 10*3/uL (ref 0.0–0.2)
Basos: 0 %
EOS (ABSOLUTE): 0.1 10*3/uL (ref 0.0–0.4)
Eos: 1 %
Hematocrit: 34.8 % (ref 34.0–46.6)
Hemoglobin: 11.8 g/dL (ref 11.1–15.9)
Hepatitis B Surface Ag: NEGATIVE
Immature Grans (Abs): 0.1 10*3/uL (ref 0.0–0.1)
Immature Granulocytes: 1 %
Lymphocytes Absolute: 1.9 10*3/uL (ref 0.7–3.1)
Lymphs: 18 %
MCH: 30.6 pg (ref 26.6–33.0)
MCHC: 33.9 g/dL (ref 31.5–35.7)
MCV: 90 fL (ref 79–97)
Monocytes Absolute: 0.5 10*3/uL (ref 0.1–0.9)
Monocytes: 5 %
Neutrophils Absolute: 8 10*3/uL — ABNORMAL HIGH (ref 1.4–7.0)
Neutrophils: 75 %
Platelets: 231 10*3/uL (ref 150–450)
RBC: 3.86 x10E6/uL (ref 3.77–5.28)
RDW: 12.8 % (ref 11.7–15.4)
RPR Ser Ql: NONREACTIVE
Rh Factor: NEGATIVE
WBC: 10.4 10*3/uL (ref 3.4–10.8)

## 2020-02-04 LAB — QUANTIFERON-TB GOLD PLUS
QuantiFERON Mitogen Value: 4.61 IU/mL
QuantiFERON Nil Value: 0.05 IU/mL
QuantiFERON TB1 Ag Value: 0.06 IU/mL
QuantiFERON TB2 Ag Value: 0.06 IU/mL
QuantiFERON-TB Gold Plus: NEGATIVE

## 2020-02-04 LAB — CHLAMYDIA/GC NAA, CONFIRMATION
Chlamydia trachomatis, NAA: NEGATIVE
Neisseria gonorrhoeae, NAA: NEGATIVE

## 2020-02-04 LAB — HGB FRACTIONATION CASCADE
Hgb A2: 2.6 % (ref 1.8–3.2)
Hgb A: 97.4 % (ref 96.4–98.8)
Hgb F: 0 % (ref 0.0–2.0)
Hgb S: 0 %

## 2020-02-04 LAB — PAP IG (IMAGE GUIDED): PAP Smear Comment: 0

## 2020-02-04 LAB — URINE CULTURE

## 2020-02-06 NOTE — Progress Notes (Signed)
Chart reviewed by Pharmacist  Suzanne Walker PharmD, Contract Pharmacist at [facility]  

## 2020-02-08 ENCOUNTER — Ambulatory Visit
Admission: RE | Admit: 2020-02-08 | Discharge: 2020-02-08 | Disposition: A | Discharge status: Home / Self Care | Payer: Self-pay | Source: Ambulatory Visit | Attending: Family Medicine | Admitting: Family Medicine

## 2020-02-08 ENCOUNTER — Other Ambulatory Visit: Payer: Self-pay

## 2020-02-08 ENCOUNTER — Encounter: Payer: Self-pay | Admitting: Family Medicine

## 2020-02-08 DIAGNOSIS — Z3480 Encounter for supervision of other normal pregnancy, unspecified trimester: Secondary | ICD-10-CM

## 2020-02-08 DIAGNOSIS — Z3482 Encounter for supervision of other normal pregnancy, second trimester: Secondary | ICD-10-CM | POA: Insufficient documentation

## 2020-02-18 NOTE — Addendum Note (Signed)
Addended by: Heywood Bene on: 02/18/2020 01:29 PM   Modules accepted: Orders

## 2020-03-01 ENCOUNTER — Other Ambulatory Visit: Payer: Self-pay

## 2020-03-01 ENCOUNTER — Ambulatory Visit: Payer: Self-pay | Admitting: Family Medicine

## 2020-03-01 ENCOUNTER — Encounter: Payer: Self-pay | Admitting: Family Medicine

## 2020-03-01 VITALS — BP 113/67 | Temp 97.9°F | Wt 145.0 lb

## 2020-03-01 DIAGNOSIS — Z6791 Unspecified blood type, Rh negative: Secondary | ICD-10-CM

## 2020-03-01 DIAGNOSIS — O26892 Other specified pregnancy related conditions, second trimester: Secondary | ICD-10-CM

## 2020-03-01 DIAGNOSIS — Z3480 Encounter for supervision of other normal pregnancy, unspecified trimester: Secondary | ICD-10-CM

## 2020-03-01 NOTE — Progress Notes (Signed)
  PRENATAL VISIT NOTE  Subjective:  Betty Avila is a 24 y.o. G2P1001 at [redacted]w[redacted]d being seen today for ongoing prenatal care.  She is currently monitored for the following issues for this low-risk pregnancy and has Supervision of other normal pregnancy, antepartum; Late prenatal care; and Rh negative status during pregnancy in second trimester, antepartum on their problem list. Pt's partner is present during visit.  Patient reports no complaints. States nausea continues but is improving. Contractions: Not present. Vag. Bleeding: None.  Movement: Present. Denies leaking of fluid/ROM.   The following portions of the patient's history were reviewed and updated as appropriate: allergies, current medications, past family history, past medical history, past social history, past surgical history and problem list. Problem list updated.  Objective:   Vitals:   03/01/20 1056  BP: 113/67  Temp: 97.9 F (36.6 C)  Weight: 145 lb (65.8 kg)    Fetal Status: Fetal Heart Rate (bpm): 150 Fundal Height: 21 cm Movement: Present     General:  Alert, oriented and cooperative. Patient is in no acute distress.  Skin: Skin is warm and dry. No rash noted.   Cardiovascular: Normal heart rate noted  Respiratory: Normal respiratory effort, no problems with respiration noted  Abdomen: Soft, gravid, appropriate for gestational age.  Pain/Pressure: Absent     Pelvic: Cervical exam deferred        Extremities: Normal range of motion.  Edema: None  Mental Status: Normal mood and affect. Normal behavior. Normal judgment and thought content.   Assessment and Plan:  Pregnancy: G2P1001 at [redacted]w[redacted]d  1. Supervision of other normal pregnancy, antepartum -Up to date. Reviewed [place] results - per pt she was told they would schedule a repeat to see some more views, but no discussion of repeat needed per [place] report, though RVOT was not visualized. Pt will call today to verify if repeat needed or not.  -She is transferring  care to [facility] as this is closer to home.   2. Rh negative status during pregnancy in second trimester, antepartum -Needs repeat antibody screen & Rhophylac ~28 wks    Preterm labor symptoms and general obstetric precautions including but not limited to vaginal bleeding, contractions, leaking of fluid and fetal movement were reviewed in detail with the patient. Please refer to After Visit Summary for other counseling recommendations.  Return in about 4 weeks (around 03/29/2020) for routine prenatal care.  Future Appointments  Date Time Provider Department Center  03/29/2020 11:00 AM [facility] PROVIDER [facility] None    Ann Held, PA-C

## 2020-03-01 NOTE — Progress Notes (Signed)
Patient here for MH RV at 21 3/7. Declines Quad screen today, declination signed. States this will be her last appointment at [facility], switching to health department in [place]. Plans to sign ROI at her 1st appointment in [place] later this week.Burt Knack, RN

## 2020-03-01 NOTE — Progress Notes (Signed)
Patient states she is changing maternity care to continue in [place], but wanted to make 4 week appointment at [facility]. Patient counseled to call to cancel appointment if she no longer needs it.Burt Knack, RN

## 2020-03-28 ENCOUNTER — Other Ambulatory Visit: Payer: Self-pay

## 2020-03-28 ENCOUNTER — Ambulatory Visit: Payer: Self-pay | Admitting: Advanced Practice Midwife

## 2020-03-28 ENCOUNTER — Telehealth: Payer: Self-pay | Admitting: General Practice

## 2020-03-28 VITALS — BP 118/78 | HR 84 | Temp 97.9°F | Wt 151.2 lb

## 2020-03-28 DIAGNOSIS — O0930 Supervision of pregnancy with insufficient antenatal care, unspecified trimester: Secondary | ICD-10-CM

## 2020-03-28 DIAGNOSIS — Z3480 Encounter for supervision of other normal pregnancy, unspecified trimester: Secondary | ICD-10-CM

## 2020-03-28 LAB — WET PREP FOR TRICH, YEAST, CLUE
Trichomonas Exam: NEGATIVE
Yeast Exam: NEGATIVE

## 2020-03-28 NOTE — Progress Notes (Signed)
   PRENATAL VISIT NOTE  Subjective:  Betty Avila is a 24 y.o. G2P1001 at [redacted]w[redacted]d being seen today for ongoing prenatal care.  She is currently monitored for the following issues for this low-risk pregnancy and has Supervision of other normal pregnancy, antepartum; Late prenatal care; and Rh negative status during pregnancy in second trimester, antepartum on their problem list.  Patient reports sinus pressure, stuffy/runny nose with clear drainage, headache, sneezing, body aches, fatigue, sore throat, sl cough onset 03/23/20 sl relieved with Sudafed x 2 days. Pt wanting wet mount for "BV"--external itching with increased yellow leukorrhea x 1 week.  States FOB and she recently broke up.  Pt refusing STD screen.  Contractions: Not present. Vag. Bleeding: None.  Movement: Present. Denies leaking of fluid/ROM.   The following portions of the patient's history were reviewed and updated as appropriate: allergies, current medications, past family history, past medical history, past social history, past surgical history and problem list. Problem list updated.  Objective:   Vitals:   03/28/20 1434  BP: 118/78  Pulse: 84  Temp: 97.9 F (36.6 C)  Weight: 151 lb 3.2 oz (68.6 kg)    Fetal Status: Fetal Heart Rate (bpm): 160 Fundal Height: 27 cm Movement: Present     General:  Alert, oriented and cooperative. Patient is in no acute distress.  Skin: Skin is warm and dry. No rash noted.   Cardiovascular: Normal heart rate noted  Respiratory: Normal respiratory effort, no problems with respiration noted  Abdomen: Soft, gravid, appropriate for gestational age.  Pain/Pressure: Absent     Pelvic: Cervical exam deferred        Extremities: Normal range of motion.  Edema: None  Mental Status: Normal mood and affect. Normal behavior. Normal judgment and thought content.   Assessment and Plan:  Pregnancy: G2P1001 at [redacted]w[redacted]d  1. Supervision of other normal pregnancy, antepartum Pt counseled  strongly to get COVID test ASAP Continue Sudafed or Mucinex, hot steam with towel over head TID, Tylenol.  If no relief in 2 days to go to primary care MD Please give primary care list to pt Treat wet mount per standing orders Pt transferring to [place] Please tell pt where she can get COVID test - WET PREP FOR TRICH, YEAST, CLUE  2. Late prenatal care    Preterm labor symptoms and general obstetric precautions including but not limited to vaginal bleeding, contractions, leaking of fluid and fetal movement were reviewed in detail with the patient. Please refer to After Visit Summary for other counseling recommendations.  No follow-ups on file.  No future appointments.  Alberteen Spindle, CNM

## 2020-03-28 NOTE — Telephone Encounter (Signed)
Patient called stating she wanted to make an appt because she has been having ongoing headaches. Patient states "this may be allergies" denies nasal congestin, cough or fever. States she just has a bad headache and Tylenol is not helping. States baby is moving well. RN rescheduled patient RV from tomorrow to today at 2:20. Instructed to arrive at 2:00 for check in. Tawny Hopping, RN

## 2020-03-28 NOTE — Progress Notes (Signed)
In for visit; c/o HA's unrelieved with Tylenol, did take Excedrin & counseled against in pregnancy; taking PNV; plans transfer to [facility]-to sign ROI today Sharlette Dense, RN

## 2020-03-29 ENCOUNTER — Ambulatory Visit: Payer: Self-pay

## 2020-04-04 ENCOUNTER — Telehealth: Payer: Self-pay

## 2020-04-04 NOTE — Telephone Encounter (Signed)
Call returned-reports wasn't able to get appt at [facility] yet due to no Medicaid currently and is waiting on [facility].  Scheduled f/u appt 04/18/20 for RV/labs/Rhophylac Sharlette Dense, RN

## 2020-04-04 NOTE — Telephone Encounter (Signed)
Attempted call to confirm if appt scheduled at [facility] for transfer of care; left voicemail message Sharlette Dense, RN

## 2020-04-14 LAB — OB RESULTS CONSOLE HIV ANTIBODY (ROUTINE TESTING): HIV: NONREACTIVE

## 2020-04-15 ENCOUNTER — Telehealth: Payer: Self-pay

## 2020-04-15 NOTE — Telephone Encounter (Signed)
Spoke with client-confirms has transferred to [facility] and had 1 hr gtt and will have 3 Hr Gtt and Rhophylac injection next week Sharlette Dense, RN

## 2020-04-18 ENCOUNTER — Ambulatory Visit: Payer: Self-pay

## 2020-06-17 LAB — OB RESULTS CONSOLE GBS: GBS: NEGATIVE

## 2020-07-10 ENCOUNTER — Encounter (HOSPITAL_COMMUNITY): Payer: Self-pay | Admitting: Emergency Medicine

## 2020-07-10 ENCOUNTER — Inpatient Hospital Stay (HOSPITAL_COMMUNITY): Payer: Medicaid Other | Admitting: Anesthesiology

## 2020-07-10 ENCOUNTER — Other Ambulatory Visit: Payer: Self-pay

## 2020-07-10 ENCOUNTER — Inpatient Hospital Stay (HOSPITAL_COMMUNITY)
Admission: AD | Admit: 2020-07-10 | Discharge: 2020-07-11 | DRG: 806 | Disposition: A | Discharge status: Home / Self Care | Payer: Medicaid Other | Attending: Obstetrics and Gynecology | Admitting: Obstetrics and Gynecology

## 2020-07-10 DIAGNOSIS — Z20822 Contact with and (suspected) exposure to covid-19: Secondary | ICD-10-CM | POA: Diagnosis present

## 2020-07-10 DIAGNOSIS — D62 Acute posthemorrhagic anemia: Secondary | ICD-10-CM | POA: Diagnosis not present

## 2020-07-10 DIAGNOSIS — Z6791 Unspecified blood type, Rh negative: Secondary | ICD-10-CM

## 2020-07-10 DIAGNOSIS — Z91040 Latex allergy status: Secondary | ICD-10-CM | POA: Diagnosis not present

## 2020-07-10 DIAGNOSIS — O9081 Anemia of the puerperium: Secondary | ICD-10-CM | POA: Diagnosis not present

## 2020-07-10 DIAGNOSIS — O9832 Other infections with a predominantly sexual mode of transmission complicating childbirth: Secondary | ICD-10-CM | POA: Diagnosis present

## 2020-07-10 DIAGNOSIS — O26892 Other specified pregnancy related conditions, second trimester: Secondary | ICD-10-CM

## 2020-07-10 DIAGNOSIS — Z3A40 40 weeks gestation of pregnancy: Secondary | ICD-10-CM

## 2020-07-10 DIAGNOSIS — Z87891 Personal history of nicotine dependence: Secondary | ICD-10-CM | POA: Diagnosis not present

## 2020-07-10 DIAGNOSIS — B009 Herpesviral infection, unspecified: Secondary | ICD-10-CM | POA: Insufficient documentation

## 2020-07-10 DIAGNOSIS — O480 Post-term pregnancy: Secondary | ICD-10-CM

## 2020-07-10 DIAGNOSIS — O0930 Supervision of pregnancy with insufficient antenatal care, unspecified trimester: Secondary | ICD-10-CM

## 2020-07-10 DIAGNOSIS — O26893 Other specified pregnancy related conditions, third trimester: Secondary | ICD-10-CM | POA: Diagnosis present

## 2020-07-10 DIAGNOSIS — A6000 Herpesviral infection of urogenital system, unspecified: Secondary | ICD-10-CM | POA: Diagnosis present

## 2020-07-10 LAB — RAPID URINE DRUG SCREEN, HOSP PERFORMED
Amphetamines: NOT DETECTED
Barbiturates: NOT DETECTED
Benzodiazepines: NOT DETECTED
Cocaine: NOT DETECTED
Opiates: NOT DETECTED
Tetrahydrocannabinol: NOT DETECTED

## 2020-07-10 LAB — CBC
HCT: 34.7 % — ABNORMAL LOW (ref 36.0–46.0)
Hemoglobin: 11.7 g/dL — ABNORMAL LOW (ref 12.0–15.0)
MCH: 31.6 pg (ref 26.0–34.0)
MCHC: 33.7 g/dL (ref 30.0–36.0)
MCV: 93.8 fL (ref 80.0–100.0)
Platelets: 139 10*3/uL — ABNORMAL LOW (ref 150–400)
RBC: 3.7 MIL/uL — ABNORMAL LOW (ref 3.87–5.11)
RDW: 14.6 % (ref 11.5–15.5)
WBC: 8.8 10*3/uL (ref 4.0–10.5)
nRBC: 0 % (ref 0.0–0.2)

## 2020-07-10 LAB — COMPREHENSIVE METABOLIC PANEL
ALT: 17 U/L (ref 0–44)
AST: 28 U/L (ref 15–41)
Albumin: 2.7 g/dL — ABNORMAL LOW (ref 3.5–5.0)
Alkaline Phosphatase: 268 U/L — ABNORMAL HIGH (ref 38–126)
Anion gap: 11 (ref 5–15)
BUN: 12 mg/dL (ref 6–20)
CO2: 18 mmol/L — ABNORMAL LOW (ref 22–32)
Calcium: 9 mg/dL (ref 8.9–10.3)
Chloride: 108 mmol/L (ref 98–111)
Creatinine, Ser: 0.74 mg/dL (ref 0.44–1.00)
GFR calc Af Amer: >60 mL/min (ref >60)
GFR calc non Af Amer: >60 mL/min (ref >60)
Glucose, Bld: 108 mg/dL — ABNORMAL HIGH (ref 70–99)
Potassium: 4 mmol/L (ref 3.5–5.1)
Sodium: 137 mmol/L (ref 135–145)
Total Bilirubin: 0.4 mg/dL (ref 0.3–1.2)
Total Protein: 6.1 g/dL — ABNORMAL LOW (ref 6.5–8.1)

## 2020-07-10 LAB — TYPE AND SCREEN
ABO/RH(D): O NEG
Antibody Screen: POSITIVE

## 2020-07-10 LAB — PROTEIN / CREATININE RATIO, URINE
Creatinine, Urine: 60.3 mg/dL
Total Protein, Urine: <6 mg/dL

## 2020-07-10 LAB — SARS CORONAVIRUS 2 BY RT PCR (HOSPITAL ORDER, PERFORMED IN ~~LOC~~ HOSPITAL LAB): SARS Coronavirus 2: NEGATIVE

## 2020-07-10 LAB — RPR: RPR Ser Ql: NONREACTIVE

## 2020-07-10 LAB — POCT FERN TEST: POCT Fern Test: POSITIVE

## 2020-07-10 MED ORDER — FENTANYL-BUPIVACAINE-NACL 0.5-0.125-0.9 MG/250ML-% EP SOLN
12.0000 mL/h | EPIDURAL | Status: DC | PRN
  Filled 2020-07-10: qty 250

## 2020-07-10 MED ORDER — LACTATED RINGERS IV SOLN: 500.0000 mL | INTRAVENOUS | Status: DC | PRN

## 2020-07-10 MED ORDER — WITCH HAZEL-GLYCERIN EX PADS: 1.0000 "application " | MEDICATED_PAD | CUTANEOUS | Status: DC | PRN

## 2020-07-10 MED ORDER — ONDANSETRON HCL 4 MG/2ML IJ SOLN: 4.0000 mg | INTRAMUSCULAR | Status: DC | PRN

## 2020-07-10 MED ORDER — DIBUCAINE (PERIANAL) 1 % EX OINT: 1.0000 "application " | TOPICAL_OINTMENT | CUTANEOUS | Status: DC | PRN

## 2020-07-10 MED ORDER — PHENYLEPHRINE 40 MCG/ML (10ML) SYRINGE FOR IV PUSH (FOR BLOOD PRESSURE SUPPORT): 80.0000 ug | PREFILLED_SYRINGE | INTRAVENOUS | Status: DC | PRN

## 2020-07-10 MED ORDER — ONDANSETRON HCL 4 MG/2ML IJ SOLN: 4.0000 mg | Freq: Four times a day (QID) | INTRAMUSCULAR | Status: DC | PRN

## 2020-07-10 MED ORDER — FENTANYL CITRATE (PF) 100 MCG/2ML IJ SOLN: 50.0000 ug | INTRAMUSCULAR | Status: DC | PRN

## 2020-07-10 MED ORDER — EPHEDRINE 5 MG/ML INJ: 10.0000 mg | INTRAVENOUS | Status: DC | PRN

## 2020-07-10 MED ORDER — ONDANSETRON HCL 4 MG PO TABS: 4.0000 mg | ORAL_TABLET | ORAL | Status: DC | PRN

## 2020-07-10 MED ORDER — DIPHENHYDRAMINE HCL 25 MG PO CAPS: 25.0000 mg | ORAL_CAPSULE | Freq: Four times a day (QID) | ORAL | Status: DC | PRN

## 2020-07-10 MED ORDER — LACTATED RINGERS IV SOLN: INTRAVENOUS | Status: DC

## 2020-07-10 MED ORDER — RHO D IMMUNE GLOBULIN 1500 UNIT/2ML IJ SOSY
300.0000 ug | PREFILLED_SYRINGE | Freq: Once | INTRAMUSCULAR | Status: DC
  Filled 2020-07-10: qty 2

## 2020-07-10 MED ORDER — LIDOCAINE HCL (PF) 1 % IJ SOLN: 30.0000 mL | INTRAMUSCULAR | Status: DC | PRN

## 2020-07-10 MED ORDER — SENNOSIDES-DOCUSATE SODIUM 8.6-50 MG PO TABS
2.0000 | ORAL_TABLET | ORAL | Status: DC
  Administered 2020-07-10: 2 via ORAL
  Filled 2020-07-10: qty 2

## 2020-07-10 MED ORDER — ZOLPIDEM TARTRATE 5 MG PO TABS: 5.0000 mg | ORAL_TABLET | Freq: Every evening | ORAL | Status: DC | PRN

## 2020-07-10 MED ORDER — OXYTOCIN-SODIUM CHLORIDE 30-0.9 UT/500ML-% IV SOLN
2.5000 [IU]/h | INTRAVENOUS | Status: DC
  Filled 2020-07-10: qty 500

## 2020-07-10 MED ORDER — SOD CITRATE-CITRIC ACID 500-334 MG/5ML PO SOLN: 30.0000 mL | ORAL | Status: DC | PRN

## 2020-07-10 MED ORDER — SODIUM CHLORIDE (PF) 0.9 % IJ SOLN
INTRAMUSCULAR | Status: DC | PRN
  Administered 2020-07-10: 12 mL/h via EPIDURAL

## 2020-07-10 MED ORDER — DIPHENHYDRAMINE HCL 50 MG/ML IJ SOLN: 12.5000 mg | INTRAMUSCULAR | Status: DC | PRN

## 2020-07-10 MED ORDER — COCONUT OIL OIL: 1.0000 "application " | TOPICAL_OIL | Status: DC | PRN

## 2020-07-10 MED ORDER — SIMETHICONE 80 MG PO CHEW: 80.0000 mg | CHEWABLE_TABLET | ORAL | Status: DC | PRN

## 2020-07-10 MED ORDER — MISOPROSTOL 200 MCG PO TABS
800.0000 ug | ORAL_TABLET | Freq: Once | ORAL | Status: AC
  Administered 2020-07-10: 800 ug via RECTAL

## 2020-07-10 MED ORDER — ACETAMINOPHEN 325 MG PO TABS
650.0000 mg | ORAL_TABLET | ORAL | Status: DC | PRN
  Administered 2020-07-10 – 2020-07-11 (×4): 650 mg via ORAL
  Filled 2020-07-10 (×4): qty 2

## 2020-07-10 MED ORDER — LACTATED RINGERS IV SOLN
500.0000 mL | Freq: Once | INTRAVENOUS | Status: AC
  Administered 2020-07-10: 500 mL via INTRAVENOUS

## 2020-07-10 MED ORDER — MISOPROSTOL 200 MCG PO TABS
ORAL_TABLET | ORAL | Status: AC
  Filled 2020-07-10: qty 4

## 2020-07-10 MED ORDER — ACETAMINOPHEN 325 MG PO TABS: 650.0000 mg | ORAL_TABLET | ORAL | Status: DC | PRN

## 2020-07-10 MED ORDER — OXYCODONE HCL 5 MG PO TABS
5.0000 mg | ORAL_TABLET | ORAL | Status: DC | PRN
  Administered 2020-07-11 (×4): 5 mg via ORAL
  Filled 2020-07-10 (×4): qty 1

## 2020-07-10 MED ORDER — OXYTOCIN BOLUS FROM INFUSION
333.0000 mL | Freq: Once | INTRAVENOUS | Status: AC
  Administered 2020-07-10: 333 mL via INTRAVENOUS

## 2020-07-10 MED ORDER — TETANUS-DIPHTH-ACELL PERTUSSIS 5-2.5-18.5 LF-MCG/0.5 IM SUSP: 0.5000 mL | Freq: Once | INTRAMUSCULAR | Status: DC

## 2020-07-10 MED ORDER — IBUPROFEN 600 MG PO TABS
600.0000 mg | ORAL_TABLET | Freq: Four times a day (QID) | ORAL | Status: DC
  Administered 2020-07-10 – 2020-07-11 (×5): 600 mg via ORAL
  Filled 2020-07-10 (×5): qty 1

## 2020-07-10 MED ORDER — BENZOCAINE-MENTHOL 20-0.5 % EX AERO
1.0000 "application " | INHALATION_SPRAY | CUTANEOUS | Status: DC | PRN
  Administered 2020-07-10: 1 via TOPICAL
  Filled 2020-07-10: qty 56

## 2020-07-10 MED ORDER — PRENATAL MULTIVITAMIN CH
1.0000 | ORAL_TABLET | Freq: Every day | ORAL | Status: DC
  Administered 2020-07-10 – 2020-07-11 (×2): 1 via ORAL
  Filled 2020-07-10: qty 1

## 2020-07-10 MED ORDER — LIDOCAINE HCL (PF) 1 % IJ SOLN
INTRAMUSCULAR | Status: DC | PRN
  Administered 2020-07-10: 10 mL via EPIDURAL

## 2020-07-10 MED ORDER — OXYCODONE HCL 5 MG PO TABS: 10.0000 mg | ORAL_TABLET | ORAL | Status: DC | PRN

## 2020-07-10 NOTE — H&P (Signed)
LABOR AND DELIVERY ADMISSION HISTORY AND PHYSICAL NOTE  Betty Avila is a 24 y.o. female G2P1001 with IUP at [redacted]w[redacted]d by LMP presenting for SOL/SROM. She reports her SROM occurred today at [place]. She reports positive fetal movement. She denies vaginal bleeding.  Prenatal History/Complications: PNC at [facility] Pregnancy complications:  - Late prenatal care - Rh negative  - HSV infection complicating pregnancy, on Valtrex suppression   Past Medical History: Past Medical History:  Diagnosis Date  . Anemia    2017 Pregnancy  . Anxiety    No med, saw counselor  . Asthma   . History of recurrent UTIs   . Hypertension    BP elevated @ delivery, per client  . Medical history non-contributory     Past Surgical History: Past Surgical History:  Procedure Laterality Date  . Denies surgical history    . NO PAST SURGERIES      Obstetrical History: OB History    Gravida  2   Para  1   Term  1   Preterm      AB      Living  1     SAB      TAB      Ectopic      Multiple  0   Live Births  1           Social History: Social History   Socioeconomic History  . Marital status: Single    Spouse name: Not on file  . Number of children: Not on file  . Years of education: Not on file  . Highest education level: Not on file  Occupational History  . Not on file  Tobacco Use  . Smoking status: Former Smoker    Years: 1.00  . Smokeless tobacco: Never Used  . Tobacco comment: cigar  Substance and Sexual Activity  . Alcohol use: Not Currently    Alcohol/week: 0.0 standard drinks    Comment: occasion  . Drug use: No  . Sexual activity: Yes  Other Topics Concern  . Not on file  Social History Narrative  . Not on file   Social Determinants of Health   Financial Resource Strain: Medium Risk  . Difficulty of Paying Living Expenses: Somewhat hard  Food Insecurity: No Food Insecurity  . Worried About Programme researcher, broadcasting/film/video in the Last Year: Never true  . Ran Out  of Food in the Last Year: Never true  Transportation Needs: Unmet Transportation Needs  . Lack of Transportation (Medical): Yes  . Lack of Transportation (Non-Medical): Yes  Physical Activity:   . Days of Exercise per Week:   . Minutes of Exercise per Session:   Stress:   . Feeling of Stress :   Social Connections:   . Frequency of Communication with Friends and Family:   . Frequency of Social Gatherings with Friends and Family:   . Attends Religious Services:   . Active Member of Clubs or Organizations:   . Attends Banker Meetings:   Marland Kitchen Marital Status:     Family History: No family history on file.  Allergies: Allergies  Allergen Reactions  . Latex     Medications Prior to Admission  Medication Sig Dispense Refill Last Dose  . acetaminophen (TYLENOL) 500 MG tablet Take 1,000 mg by mouth every 6 (six) hours as needed for headache.     . Prenatal Vit-Fe Fumarate-FA (MULTIVITAMIN-PRENATAL) 27-0.8 MG TABS tablet Take 1 tablet by mouth daily at 12 noon.     Marland Kitchen  promethazine (PHENERGAN) 25 MG tablet Take 1/2 - 1 tab every 4-6 hrs for nausea and vomiting. 15 tablet 0      Review of Systems  All systems reviewed and negative except as stated in HPI  Physical Exam Blood pressure 129/78, pulse 72, temperature 98.6 F (37 C), temperature source Oral, resp. rate 16, height 5\' 7"  (1.702 m), weight 68.6 kg, last menstrual period 09/01/2019, SpO2 100 %, unknown if currently breastfeeding. General appearance: alert, cooperative and mild distress Lungs: clear to auscultation bilaterally Heart: regular rate and rhythm Abdomen: soft, non-tender; bowel sounds normal Extremities: No calf swelling or tenderness Presentation: cephalic Fetal monitoring: 140/moderate/+accels/ no decelerations  Uterine activity: irregular mild contractions  Dilation: 4.5 Effacement (%): 80 Station: -1  Prenatal labs: ABO, Rh: --/--/PENDING (08/15 0240) Antibody: PENDING (08/15 0240) Rubella:   Immune  RPR: Non Reactive (03/09 1045)  HBsAg: Negative (03/09 1045)  HIV: Non-reactive (03/09 0000)  GC/Chlamydia: negative GBS:   Negative 3 hr Glucola: 79/128/123/65 Genetic screening:  Declined Anatomy 05-26-1999: normal  Prenatal Transfer Tool  Maternal Diabetes: No Genetic Screening: Declined Maternal Ultrasounds/Referrals: Normal Fetal Ultrasounds or other Referrals:  None Maternal Substance Abuse:  No Significant Maternal Medications:  Meds include: Other: Valtrex Significant Maternal Lab Results: Group B Strep negative  Results for orders placed or performed during the hospital encounter of 07/10/20 (from the past 24 hour(s))  [facility] Time: 07/10/20  2:29 AM  Result Value Ref Range   POCT Fern Test Positive = ruptured amniotic membanes   Type and screen [facility]   Collection Time: 07/10/20  2:40 AM  Result Value Ref Range   ABO/RH(D) PENDING    Antibody Screen PENDING    Sample Expiration      07/13/2020,2359 Performed at [facility], [address] [place], [place]     Patient Active Problem List   Diagnosis Date Noted  . Indication for care or intervention in labor or delivery 07/10/2020  . Limited prenatal care, antepartum 07/10/2020  . 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 Rh negative status during pregnancy in second trimester, antepartum 02/03/2020  . Supervision of other normal pregnancy, antepartum 02/02/2020  . Late prenatal care 02/02/2020    Assessment: Betty Avila is a 24 y.o. G2P1001 at [redacted]w[redacted]d here for SOL/SROM  #Labor: Expectant management, will check patient in 2-3 hours to assess for augmentation  #Pain: Plans Epidural  #FWB: Cat I  #ID:  GBS negative, HSV 2 - on suppression  #MOF: Breast #MOC:Pills   [redacted]w[redacted]d, CNM 07/10/2020, 4:03 AM

## 2020-07-10 NOTE — MAU Note (Signed)
Pt brought over from ED with complaint of contractions q5 minutes and ? Leaking fluid since 12 midnight.

## 2020-07-10 NOTE — Discharge Summary (Signed)
Postpartum Discharge Summary     Patient Name: Betty Avila DOB: Jul 23, 1996 MRN: [id number]  Date of admission: 07/10/2020 Delivery date:07/10/2020  Delivering provider: Fatima Blank A  Date of discharge: 07/11/2020  Admitting diagnosis: Indication for care or intervention in labor or delivery [O75.9] Intrauterine pregnancy: [redacted]w[redacted]d    Secondary diagnosis:  Active Problems:   NSVD (normal spontaneous vaginal delivery)   Late prenatal care   Rh negative status during pregnancy in second trimester, antepartum   Indication for care or intervention in labor or delivery  Additional problems: Anemia in pregnancy Discharge diagnosis: Term Pregnancy Delivered                                              Post partum procedures:none Augmentation: N/A Complications: None  Hospital course: Onset of Labor With Vaginal Delivery      24y.o. yo [id number] admitted in Active Labor on 07/10/2020. Patient had an uncomplicated labor course as follows:  Membrane Rupture Time/Date: 12:00 AM ,07/10/2020   Delivery Method:Vaginal, Spontaneous  Episiotomy: None  Lacerations:  1st degree;Perineal  Patient had an uncomplicated postpartum course.  She is ambulating, tolerating a regular diet, passing flatus, and urinating well. Patient is discharged home in stable condition on 07/11/20.  Newborn Data: Birth date:07/10/2020  Birth time:11:10 AM  Gender:Female  Living status:Living  Apgars:9 ,9  Weight:4115 g   Magnesium Sulfate received: No BMZ received: No Rhophylac:Yes MMR:No T-DaP:Given prenatally Flu: No Transfusion:No  Physical exam  Vitals:   07/10/20 1718 07/10/20 2120 07/11/20 0157 07/11/20 0533  BP: 134/74 123/77 130/81 116/66  Pulse: 68 69 72 68  Resp: 18 16 16 18   Temp: 99.2 F (37.3 C) 97.9 F (36.6 C) 98.1 F (36.7 C) (!) 97.5 F (36.4 C)  TempSrc:  Oral Oral Oral  SpO2:  100% 100% 98%  Weight:      Height:       General: alert, cooperative  and no distress Lochia: appropriate Uterine Fundus: firm Incision: N/A DVT Evaluation: No evidence of DVT seen on physical exam. No cords or calf tenderness. No significant calf/ankle edema. Labs: Lab Results  Component Value Date   WBC 8.8 07/10/2020   HGB 11.7 (L) 07/10/2020   HCT 34.7 (L) 07/10/2020   MCV 93.8 07/10/2020   PLT 139 (L) 07/10/2020   CMP Latest Ref Rng & Units 07/10/2020  Glucose 70 - 99 mg/dL 108(H)  BUN 6 - 20 mg/dL 12  Creatinine 0.44 - 1.00 mg/dL 0.74  Sodium 135 - 145 mmol/L 137  Potassium 3.5 - 5.1 mmol/L 4.0  Chloride 98 - 111 mmol/L 108  CO2 22 - 32 mmol/L 18(L)  Calcium 8.9 - 10.3 mg/dL 9.0  Total Protein 6.5 - 8.1 g/dL 6.1(L)  Total Bilirubin 0.3 - 1.2 mg/dL 0.4  Alkaline Phos 38 - 126 U/L 268(H)  AST 15 - 41 U/L 28  ALT 0 - 44 U/L 17   Edinburgh Score: Edinburgh Postnatal Depression Scale Screening Tool 07/11/2020  I have been able to laugh and see the funny side of things. 0  I have looked forward with enjoyment to things. 0  I have blamed myself unnecessarily when things went wrong. 1  I have been anxious or worried for no good reason. 1  I have felt scared or panicky for no good reason. 0  Things  have been getting on top of me. 1  I have been so unhappy that I have had difficulty sleeping. 0  I have felt sad or miserable. 0  I have been so unhappy that I have been crying. 0  The thought of harming myself has occurred to me. 0  Edinburgh Postnatal Depression Scale Total 3     After visit meds:  Allergies as of 07/11/2020      Reactions   Latex       Medication List    STOP taking these medications   promethazine 25 MG tablet Commonly known as: PHENERGAN   valACYclovir 500 MG tablet Commonly known as: VALTREX     TAKE these medications   acetaminophen 325 MG tablet Commonly known as: Tylenol Take 2 tablets (650 mg total) by mouth every 6 (six) hours.   coconut oil Oil Apply 1 application topically as needed (nipple  pain).   docusate sodium 100 MG capsule Commonly known as: Colace Take 1 capsule (100 mg total) by mouth 2 (two) times daily.   ibuprofen 600 MG tablet Commonly known as: ADVIL Take 1 tablet (600 mg total) by mouth every 6 (six) hours.   Iron 325 (65 Fe) MG Tabs Take 1 tablet (325 mg total) by mouth 2 times daily at 12 noon and 4 pm. What changed:   medication strength  how much to take  when to take this   multivitamin-prenatal 27-0.8 MG Tabs tablet Take 1 tablet by mouth daily at 12 noon.   norethindrone 0.35 MG tablet Commonly known as: Ortho Micronor Take 1 tablet (0.35 mg total) by mouth daily.   oxyCODONE 5 MG immediate release tablet Commonly known as: Oxy IR/ROXICODONE Take 1 tablet (5 mg total) by mouth every 6 (six) hours as needed for breakthrough pain.   polyethylene glycol 17 g packet Commonly known as: MIRALAX / GLYCOLAX Take 17 g by mouth daily as needed for moderate constipation.        Discharge home in stable condition Infant Feeding: Breast Infant Disposition:home with mother Discharge instruction: per After Visit Summary and Postpartum booklet. Activity: Advance as tolerated. Pelvic rest for 6 weeks.  Diet: routine diet Future Appointments: Pt to call [facility] to schedule 4wk PP appt Follow up Visit: plan for 4 week PP appt  Low risk pregnancy complicated by: n/a Delivery mode:  Vaginal, Spontaneous  Anticipated Birth Control:  POPs  Betty Avila, Gildardo Cranker, MD OB Fellow, Faculty Practice 07/11/2020 10:41 AM

## 2020-07-10 NOTE — ED Provider Notes (Signed)
MSE was initiated and I personally evaluated the patient and placed orders (if any) at  2:04 AM on July 10, 2020.  The patient appears stable so that the remainder of the MSE may be completed by another provider.  Called to see patient in triage.  40 weeks and 1 day pregnant.  G2.  Has been contracting every 4 to 5 minutes.  States she had about 15 contractions over the last hour.  States she has had some fluid throughout the day.  Not like when her water broke with previous pregnancy which she said was more like she was peeing on herself.  Still feels baby moving.  [facility].  Discussed with provider at MAU.  Will transfer.   Benjiman Core, MD 07/10/20 [phone]

## 2020-07-10 NOTE — ED Triage Notes (Signed)
Report given to MAU. Transported to MAU by ED staff

## 2020-07-10 NOTE — ED Triage Notes (Signed)
Patient reports contractions. [redacted] weeks pregnant, EDD yesterday.

## 2020-07-10 NOTE — Anesthesia Procedure Notes (Signed)
Epidural Patient location during procedure: OB Start time: 07/10/2020 3:28 AM End time: 07/10/2020 3:38 AM  Staffing Anesthesiologist: Elmer Picker, MD Performed: anesthesiologist   Preanesthetic Checklist Completed: patient identified, IV checked, risks and benefits discussed, monitors and equipment checked, pre-op evaluation and timeout performed  Epidural Patient position: sitting Prep: DuraPrep and site prepped and draped Patient monitoring: continuous pulse ox, blood pressure, heart rate and cardiac monitor Approach: midline Location: L3-L4 Injection technique: LOR air  Needle:  Needle type: Tuohy  Needle gauge: 17 G Needle length: 9 cm Needle insertion depth: 5 cm Catheter type: closed end flexible Catheter size: 19 Gauge Catheter at skin depth: 11 cm Test dose: negative  Assessment Sensory level: T8 Events: blood not aspirated, injection not painful, no injection resistance, no paresthesia and negative IV test  Additional Notes Patient identified. Risks/Benefits/Options discussed with patient including but not limited to bleeding, infection, nerve damage, paralysis, failed block, incomplete pain control, headache, blood pressure changes, nausea, vomiting, reactions to medication both or allergic, itching and postpartum back pain. Confirmed with bedside nurse the patient's most recent platelet count. Confirmed with patient that they are not currently taking any anticoagulation, have any bleeding history or any family history of bleeding disorders. Patient expressed understanding and wished to proceed. All questions were answered. Sterile technique was used throughout the entire procedure. Please see nursing notes for vital signs. Test dose was given through epidural catheter and negative prior to continuing to dose epidural or start infusion. Warning signs of high block given to the patient including shortness of breath, tingling/numbness in hands, complete motor block,  or any concerning symptoms with instructions to call for help. Patient was given instructions on fall risk and not to get out of bed. All questions and concerns addressed with instructions to call with any issues or inadequate analgesia.  Reason for block:procedure for pain

## 2020-07-10 NOTE — Progress Notes (Signed)
LABOR PROGRESS NOTE  Betty Avila is a 24 y.o. G2P1001 at [redacted]w[redacted]d  admitted for SOL/SROM.  Subjective: Patient doing well, comfortable with epidural at this time   Objective: BP 127/78   Pulse 62   Temp (!) 97.5 F (36.4 C) (Oral)   Resp 16   Ht 5\' 7"  (1.702 m)   Wt 68.6 kg   LMP 09/01/2019 (Approximate)   SpO2 97%   BMI 23.69 kg/m  or  Vitals:   07/10/20 0520 07/10/20 0531 07/10/20 0601 07/10/20 0630  BP:  126/79  127/78  Pulse:  63 74 62  Resp:      Temp: (!) 97.5 F (36.4 C)     TempSrc: Oral     SpO2:      Weight:      Height:        Dilation: 7 Effacement (%): 80 Station: -1 Presentation: Vertex Exam by:: [email or web address], RN FHT: baseline rate 125, moderate varibility, +accel, no decel Toco: 4-5  Labs: Lab Results  Component Value Date   WBC 8.8 07/10/2020   HGB 11.7 (L) 07/10/2020   HCT 34.7 (L) 07/10/2020   MCV 93.8 07/10/2020   PLT 139 (L) 07/10/2020    Patient Active Problem List   Diagnosis Date Noted  . Indication for care or intervention in labor or delivery 07/10/2020  . 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 Rh negative status during pregnancy in second trimester, antepartum 02/03/2020  . Supervision of other normal pregnancy, antepartum 02/02/2020  . Late prenatal care 02/02/2020    Assessment / Plan: 24 y.o. G2P1001 at [redacted]w[redacted]d here for SOL/SROM  Labor: Progressing well on own, expectant management at this time  Fetal Wellbeing:  Cat I  Pain Control:  Epidural  Anticipated MOD:  SVD  [redacted]w[redacted]d, CNM 07/10/2020, 6:49 AM

## 2020-07-10 NOTE — Progress Notes (Signed)
Betty Avila is a 24 y.o. G2P1001 at [redacted]w[redacted]d by 18 week ultrasound admitted for active labor  Subjective: Pt comfortable with epidural. Feeling more pelvic/rectal pressure intermittently. Desires cervical exam at this time.  Objective: BP 123/73   Pulse 71   Temp 98.1 F (36.7 C) (Oral)   Resp 16   Ht 5\' 7"  (1.702 m)   Wt 68.6 kg   LMP 09/01/2019 (Approximate)   SpO2 97%   BMI 23.69 kg/m  No intake/output data recorded. Total I/O In: -  Out: 525 [Urine:525]  FHT:  FHR: 135 bpm, variability: moderate,  accelerations:  Present,  decelerations:  Present early UC:   regular, every 2 minutes, moderate to palpation SVE:   Dilation: Lip/rim Effacement (%): 100 Station: 0, Plus 1 Exam by:: [email or web address]  Labs: Lab Results  Component Value Date   WBC 8.8 07/10/2020   HGB 11.7 (L) 07/10/2020   HCT 34.7 (L) 07/10/2020   MCV 93.8 07/10/2020   PLT 139 (L) 07/10/2020    Assessment / Plan: Spontaneous labor, progressing normally  Labor: Pt pushed with one contraction, anterior lip remains.  Will recheck in 1 hour or with increased or more constant rectal pressure per pt. Preeclampsia:  n/a Fetal Wellbeing:  Category I Pain Control:  Epidural I/D:  GBS neg Anticipated MOD:  NSVD  07/12/2020 07/10/2020, 10:12 AM

## 2020-07-10 NOTE — Anesthesia Preprocedure Evaluation (Signed)
Anesthesia Evaluation  Patient identified by MRN, date of birth, ID band Patient awake    Reviewed: Allergy & Precautions, NPO status , Patient's Chart, lab work & pertinent test results  Airway Mallampati: II  TM Distance: >3 FB Neck ROM: Full    Dental no notable dental hx.    Pulmonary asthma , former smoker,    Pulmonary exam normal breath sounds clear to auscultation       Cardiovascular hypertension, negative cardio ROS Normal cardiovascular exam Rhythm:Regular Rate:Normal     Neuro/Psych PSYCHIATRIC DISORDERS Anxiety negative neurological ROS     GI/Hepatic negative GI ROS, Neg liver ROS,   Endo/Other  negative endocrine ROS  Renal/GU negative Renal ROS  negative genitourinary   Musculoskeletal negative musculoskeletal ROS (+)   Abdominal   Peds  Hematology negative hematology ROS (+)   Anesthesia Other Findings   Reproductive/Obstetrics (+) Pregnancy                             Anesthesia Physical Anesthesia Plan  ASA: II  Anesthesia Plan: Epidural   Post-op Pain Management:    Induction:   PONV Risk Score and Plan: Treatment may vary due to age or medical condition  Airway Management Planned: Natural Airway  Additional Equipment:   Intra-op Plan:   Post-operative Plan:   Informed Consent: I have reviewed the patients History and Physical, chart, labs and discussed the procedure including the risks, benefits and alternatives for the proposed anesthesia with the patient or authorized representative who has indicated his/her understanding and acceptance.       Plan Discussed with: Anesthesiologist  Anesthesia Plan Comments: (Patient identified. Risks, benefits, options discussed with patient including but not limited to bleeding, infection, nerve damage, paralysis, failed block, incomplete pain control, headache, blood pressure changes, nausea, vomiting, reactions  to medication, itching, and post partum back pain. Confirmed with bedside nurse the patient's most recent platelet count. Confirmed with the patient that they are not taking any anticoagulation, have any bleeding history or any family history of bleeding disorders. Patient expressed understanding and wishes to proceed. All questions were answered. )        Anesthesia Quick Evaluation

## 2020-07-11 MED ORDER — DOCUSATE SODIUM 100 MG PO CAPS
100.0000 mg | ORAL_CAPSULE | Freq: Two times a day (BID) | ORAL | 2 refills | Status: AC
Start: 2020-07-11 — End: 2021-07-11

## 2020-07-11 MED ORDER — IBUPROFEN 600 MG PO TABS: 600.0000 mg | ORAL_TABLET | Freq: Four times a day (QID) | ORAL | 0 refills | Status: AC

## 2020-07-11 MED ORDER — ACETAMINOPHEN 325 MG PO TABS: 650.0000 mg | ORAL_TABLET | Freq: Four times a day (QID) | ORAL | Status: AC

## 2020-07-11 MED ORDER — COCONUT OIL OIL: 1.0000 "application " | TOPICAL_OIL | 0 refills | Status: AC | PRN

## 2020-07-11 MED ORDER — OXYCODONE HCL 5 MG PO TABS: 5.0000 mg | ORAL_TABLET | Freq: Four times a day (QID) | ORAL | 0 refills | Status: AC | PRN

## 2020-07-11 MED ORDER — IRON 325 (65 FE) MG PO TABS
1.0000 | ORAL_TABLET | Freq: Two times a day (BID) | ORAL | 1 refills | Status: AC
Start: 2020-07-11 — End: ?

## 2020-07-11 MED ORDER — POLYETHYLENE GLYCOL 3350 17 G PO PACK: 17.0000 g | PACK | Freq: Every day | ORAL | 1 refills | Status: AC | PRN

## 2020-07-11 MED ORDER — NORETHINDRONE 0.35 MG PO TABS
1.0000 | ORAL_TABLET | Freq: Every day | ORAL | 1 refills | Status: AC
Start: 2020-07-11 — End: 2021-07-11

## 2020-07-11 NOTE — Lactation Note (Signed)
This note was copied from a baby's chart. Lactation Consultation Note  Patient Name: Betty Avila [id number] Date: 07/11/2020 Reason for consult: Follow-up assessment  P2 mother whose infant is now 73 hours old.  This is a term baby at 40+1 weeks.  Mother breast fed her first child (now 23 years old) for one year.  Baby was asleep at mother's side when I arrived.  Mother had no questions/concerns related to breast feeding.  LATCH scores have been 7-10.  Mother's breasts and nipples are without trauma.  She will continue to feed 8-12 times/24 hours or sooner if baby shows feeding cues.  Engorgement prevention/treatment discussed.  Manual pump provided.  Mother does not have a DEBP for home use and is considering purchasing a pump.  She is a [facility] participant but may be interested in obtaining the formula package from [facility].  Informed her that she will not be able to receive a breast pump if she accepts the formula package.  Support person present and asleep on the couch.     Maternal Data    Feeding Feeding Type: Breast Fed  LATCH Score                   Interventions    Lactation Tools Discussed/Used     Consult Status Consult Status: Complete    Betty Avila 07/11/2020, 10:24 AM
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
## 2020-07-11 NOTE — Anesthesia Postprocedure Evaluation (Signed)
Anesthesia Post Note  Patient: Betty Avila  Procedure(s) Performed: AN AD HOC LABOR EPIDURAL     Patient location during evaluation: Mother Baby Anesthesia Type: Epidural Level of consciousness: awake and alert Pain management: pain level controlled Vital Signs Assessment: post-procedure vital signs reviewed and stable Respiratory status: spontaneous breathing, nonlabored ventilation and respiratory function stable Cardiovascular status: stable Postop Assessment: no headache, no backache and epidural receding Anesthetic complications: no   No complications documented.  Last Vitals:  Vitals:   07/11/20 0157 07/11/20 0533  BP: 130/81 116/66  Pulse: 72 68  Resp: 16 18  Temp: 36.7 C (!) 36.4 C  SpO2: 100% 98%    Last Pain:  Vitals:   07/11/20 0533  TempSrc: Oral  PainSc:    Pain Goal:                   [name]

## 2020-07-11 NOTE — Discharge Instructions (Signed)

## 2021-03-10 IMAGING — US US OB COMP +14 WK
1 series · 14 of 28 positions shown · non-contrast
Comparison: none

CLINICAL DATA: Second trimester pregnancy for fetal anatomy survey.

EXAM:
OBSTETRICAL ULTRASOUND >14 WKS

[Series 1: us ob comp + 14 wk · 14 of 106 slices shown]
[im 4/106]
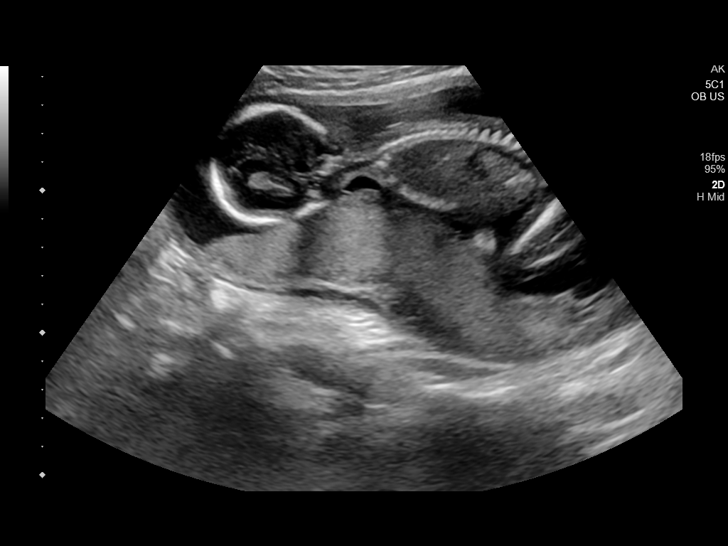
[im 12/106]
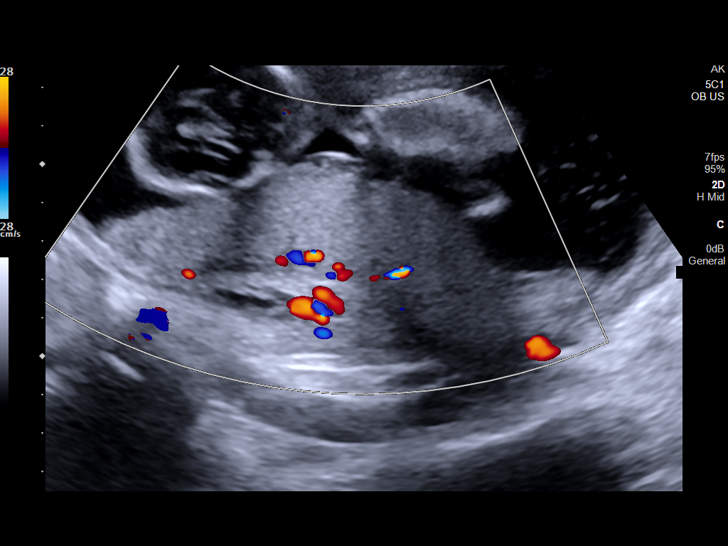
[im 20/106]
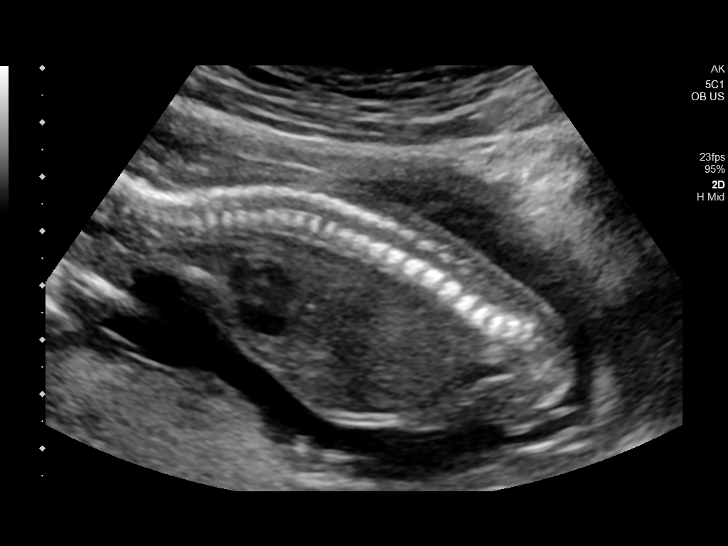
[im 28/106]
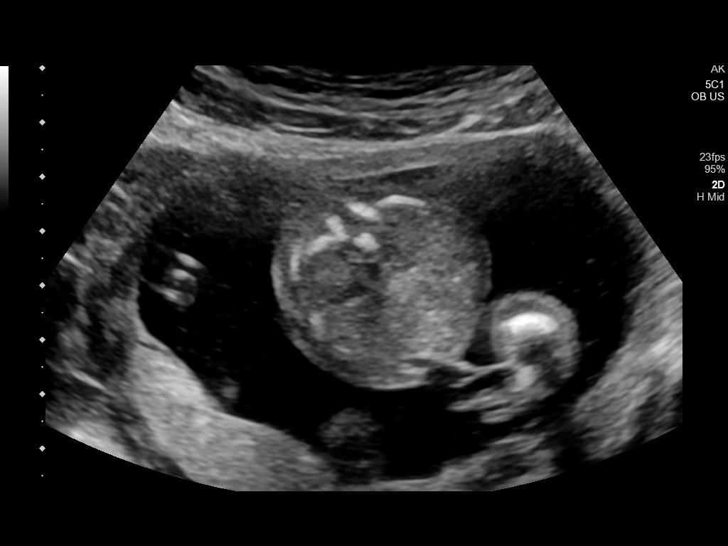
[im 36/106]
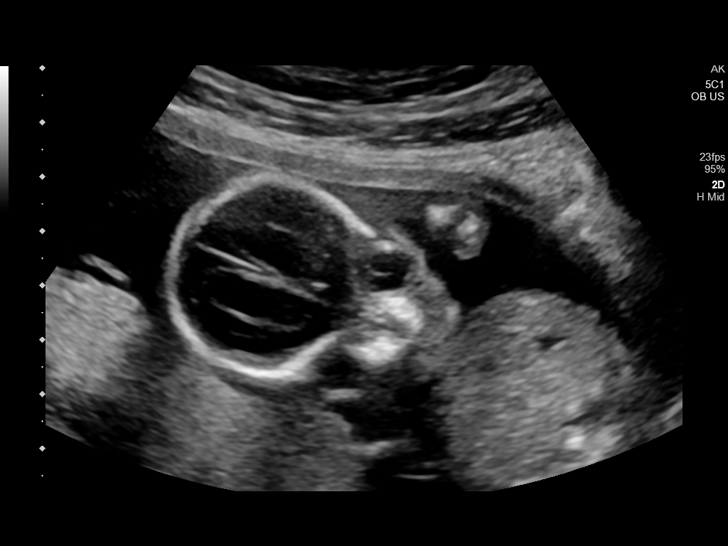
[im 43/106]
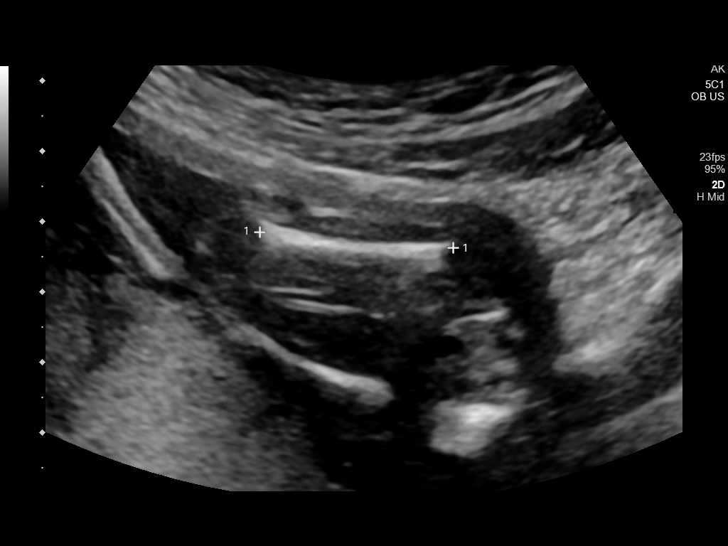
[im 51/106]
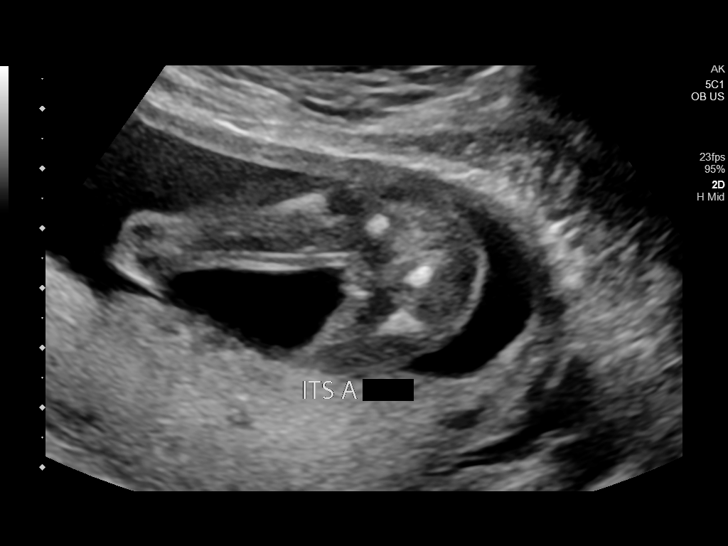
[im 59/106]
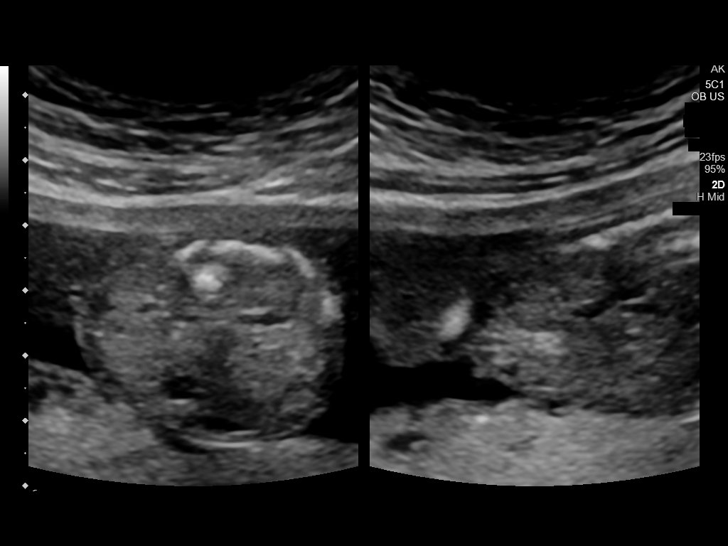
[im 67/106]
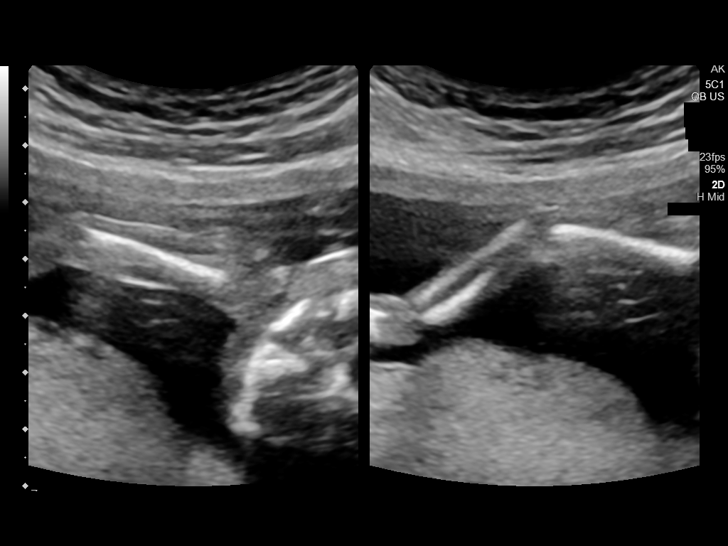
[im 74/106]
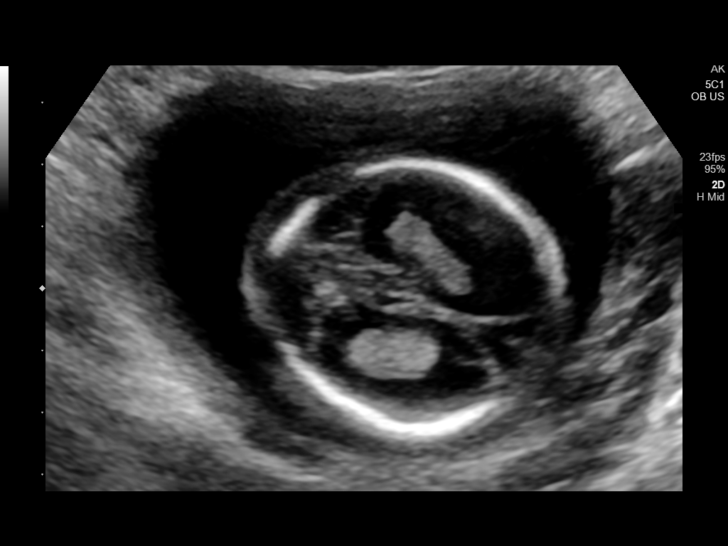
[im 82/106]
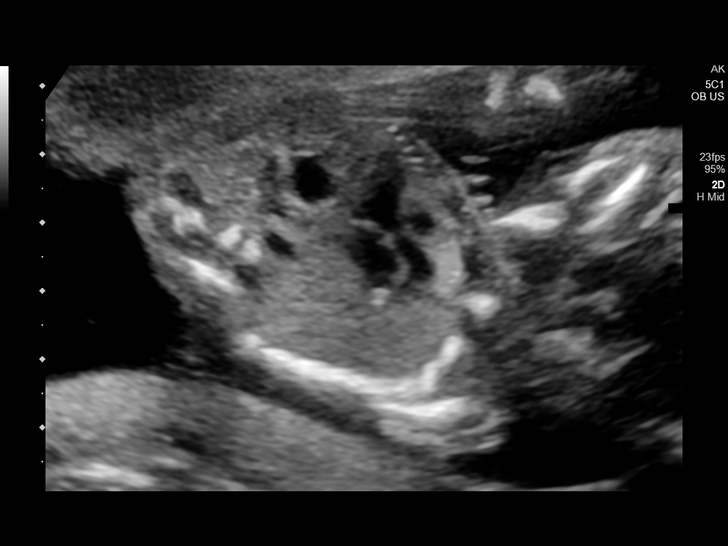
[im 90/106]
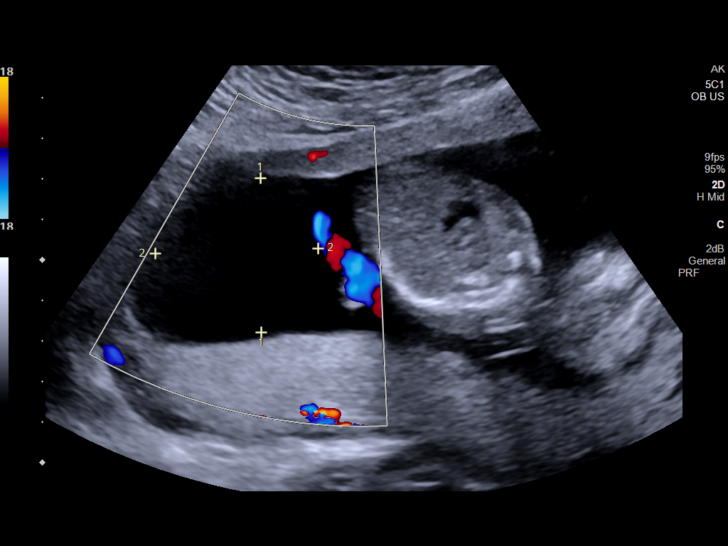
[im 98/106]
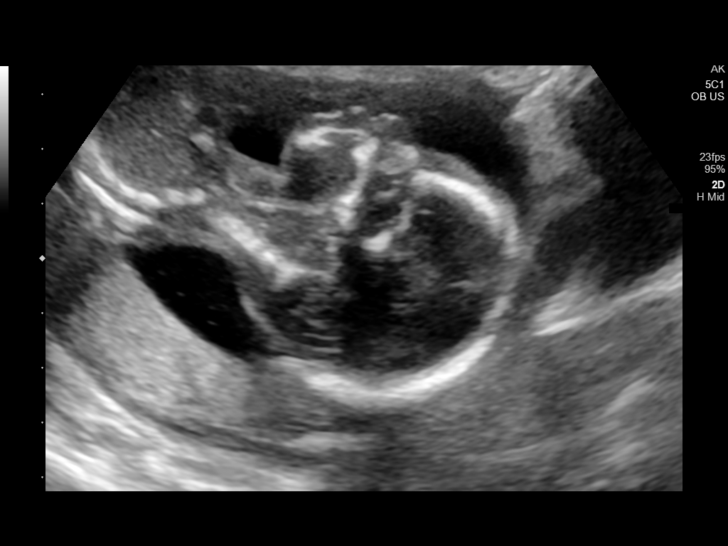
[im 106/106]
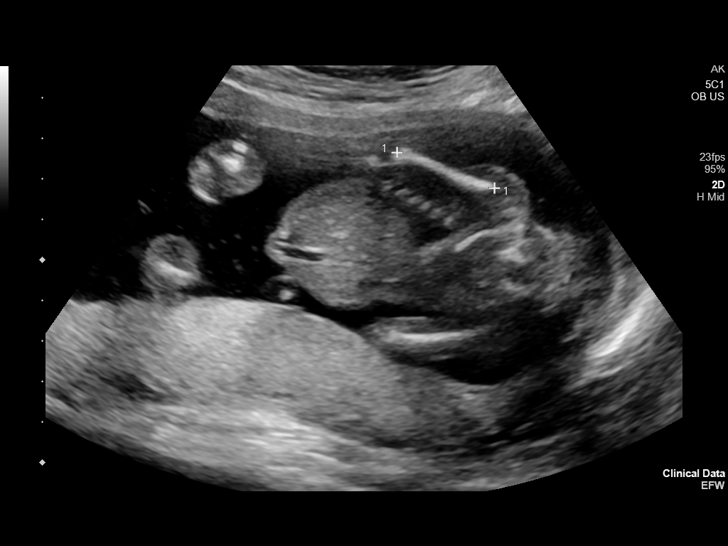

[14 of 28 positions shown; findings below may reference images not displayed]

FINDINGS: Number of Fetuses: 1

Heart Rate:  150 bpm

Movement: Yes

Presentation: Variable

Previa: No

Placental Location: Posterior

Amniotic Fluid (Subjective): Within normal limits

Amniotic Fluid (Objective):

Vertical pocket = 4.0cm

FETAL BIOMETRY

BPD: 4.3cm 18w 6d

HC:   15.8cm 18w 5d

AC:   12.3cm 18w 0d

FL:   2.7cm 18w 1d

Current Mean GA: 18w 2d US EDC: 07/09/2020

FETAL ANATOMY

Lateral Ventricles: Appears normal

Thalami/CSP: Appears normal

Posterior Fossa:  Appears normal

Nuchal Region: Appears normal   NFT= 4.0 mm

Upper Lip: Appears normal

Spine: Appears normal

4 Chamber Heart on Left: Appears normal

LVOT: Appears normal

RVOT: Not visualized

Stomach on Left: Appears normal

3 Vessel Cord: Appears normal

Cord Insertion site: Appears normal

Kidneys: Appears normal

Bladder: Appears normal

Extremities: Appears normal

Maternal Findings:

Cervix:  3.0 cm TA
IMPRESSION: Single living IUP with estimated gestational age of 18 weeks 2 days,
and US EDC of 07/09/2020.

Unremarkable anatomic survey.  No fetal anomalies identified.

## 2021-06-19 ENCOUNTER — Ambulatory Visit
Admission: EM | Admit: 2021-06-19 | Discharge: 2021-06-19 | Disposition: A | Discharge status: Home / Self Care | Payer: Self-pay | Attending: Family Medicine | Admitting: Family Medicine

## 2021-06-19 ENCOUNTER — Other Ambulatory Visit: Payer: Self-pay

## 2021-06-19 DIAGNOSIS — N898 Other specified noninflammatory disorders of vagina: Secondary | ICD-10-CM | POA: Insufficient documentation

## 2021-06-19 MED ORDER — METRONIDAZOLE 500 MG PO TABS
500.0000 mg | ORAL_TABLET | Freq: Two times a day (BID) | ORAL | 0 refills | Status: DC
Start: 2021-06-19 — End: 2021-06-20

## 2021-06-19 MED ORDER — FLUCONAZOLE 150 MG PO TABS
150.0000 mg | ORAL_TABLET | Freq: Every day | ORAL | 0 refills | Status: DC
Start: 2021-06-19 — End: 2021-06-20

## 2021-06-19 NOTE — ED Triage Notes (Signed)
Pt present vaginal irration with discharge and odor symptoms started two days ago after having intercourse.  Pt states  it started off as anal then into the vaginal and symptoms begin.

## 2021-06-19 NOTE — ED Provider Notes (Signed)
[facility]    CSN: [id number] Arrival date & time: 06/19/21  0930      History   Chief Complaint Chief Complaint  Patient presents with   Vaginal Discharge    HPI Betty Avila is a 25 y.o. female.   Patient presenting today with 2-day history of vaginal discharge, vaginal odor, vaginal itching.  Denies pelvic pain, rashes, vaginal bleeding, dysuria, hematuria, fever, chills, nausea, vomiting.  States that she was having unprotected intercourse several days ago and that they were having anal intercourse and then insertion into the vaginal canal so she is concerned that that could have caused an infection.  Not try anything over-the-counter for symptoms.  LMP 05/25/2021.   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 Authorizing Provider  fluconazole (DIFLUCAN) 150 MG tablet Take 1 tablet (150 mg total) by mouth daily. 06/19/21  Yes Particia Nearing, PA-C  metroNIDAZOLE  (FLAGYL) 500 MG tablet Take 1 tablet (500 mg total) by mouth 2 (two) times daily. 06/19/21  Yes Particia Nearing, PA-C  acetaminophen (TYLENOL) 325 MG tablet Take 2 tablets (650 mg total) by mouth every 6 (six) hours. 07/11/20   Sheila Oats, MD  coconut oil OIL Apply 1 application topically as needed (nipple pain). 07/11/20   Sheila Oats, MD  docusate sodium (COLACE) 100 MG capsule Take 1 capsule (100 mg total) by mouth 2 (two) times daily. 07/11/20 07/11/21  Sheila Oats, MD  Ferrous Sulfate (IRON) 325 (65 Fe) MG TABS Take 1 tablet (325 mg total) by mouth 2 times daily at 12 noon and 4 pm. 07/11/20   Sheila Oats, MD  ibuprofen (ADVIL) 600 MG tablet Take 1 tablet (600 mg total) by mouth every 6 (six) hours. 07/11/20   Sheila Oats, MD  norethindrone (ORTHO MICRONOR) 0.35 MG tablet Take 1 tablet (0.35 mg total) by mouth daily. 07/11/20 07/11/21  Sheila Oats, MD  oxyCODONE (OXY IR/ROXICODONE) 5 MG immediate release tablet Take 1 tablet (5 mg total) by mouth every 6 (six) hours as needed for breakthrough pain. 07/11/20   Sheila Oats, MD  polyethylene glycol (MIRALAX / GLYCOLAX) 17 g packet Take 17 g by mouth daily as needed for moderate constipation. 07/11/20   Sheila Oats, MD  Prenatal Vit-Fe Fumarate-FA (MULTIVITAMIN-PRENATAL) 27-0.8 MG TABS tablet Take 1 tablet by mouth daily at 12 noon.    [provider]    Family History History reviewed. No pertinent family history.  Social History Social History   Tobacco Use   Smoking status: Former    Years: 1.00    Types: Cigarettes   Smokeless tobacco: Never   Tobacco comments:    cigar  Substance Use Topics   Alcohol use: Not Currently    Alcohol/week: 0.0 standard drinks    Comment: occasion   Drug use: No     Allergies   Latex   Review of Systems Review of Systems Per HPI  Physical Exam Triage Vital Signs ED Triage Vitals  Enc Vitals Group     BP 06/19/21 1248 108/77     Pulse Rate 06/19/21  1248 88     Resp 06/19/21 1248 18     Temp 06/19/21 1248 98.9 F (37.2 C)     Temp Source 06/19/21 1248 Oral     SpO2 06/19/21 1248 95 %     Weight --      Height --      Head Circumference --      Peak Flow --      Pain Score 06/19/21 1245 0     Pain Loc --      Pain Edu? --      Excl. in GC? --    No data found.  Updated Vital Signs BP 108/77 (BP Location: Left Arm)   Pulse 88   Temp 98.9 F (37.2 C) (Oral)   Resp 18   LMP 05/25/2021   SpO2 95%   Visual Acuity Right Eye Distance:   Left Eye Distance:   Bilateral Distance:    Right Eye Near:   Left Eye Near:    Bilateral Near:     Physical Exam Vitals and nursing note reviewed.  Constitutional:      Appearance: Normal appearance. She is not ill-appearing.  HENT:     Head: Atraumatic.  Eyes:     Extraocular Movements: Extraocular movements intact.     Conjunctiva/sclera: Conjunctivae normal.  Cardiovascular:     Rate and Rhythm: Normal rate and regular rhythm.     Heart sounds: Normal heart sounds.  Pulmonary:     Effort: Pulmonary effort is normal.     Breath sounds: Normal breath sounds.  Abdominal:     General: Bowel sounds are normal. There is no distension.     Palpations: Abdomen is soft.     Tenderness: There is no abdominal tenderness. There is no right CVA tenderness, left CVA tenderness or guarding.  Genitourinary:    Comments: GU exam deferred, self swab performed Musculoskeletal:        General: Normal range of motion.     Cervical back: Normal range of motion and neck supple.  Skin:    General: Skin is warm and dry.  Neurological:     Mental Status: She is alert and oriented to person, place, and time.  Psychiatric:        Mood and Affect: Mood normal.        Thought Content: Thought content normal.        Judgment: Judgment normal.     UC Treatments / Results  Labs (all labs ordered  are listed, but only abnormal results are displayed) Labs Reviewed  POCT URINE PREGNANCY   CERVICOVAGINAL ANCILLARY ONLY    EKG   Radiology No results found.  Procedures Procedures (including critical care time)  Medications Ordered in UC Medications - No data to display  Initial Impression / Assessment and Plan / UC Course  I have reviewed the triage vital signs and the nursing notes.  Pertinent labs & imaging results that were available during my care of the patient were reviewed by me and considered in my medical decision making (see chart for details).     Patient states she has a history of recurrent BV and yeast infections that felt similar, will treat for both of these today while awaiting vaginal swab results.  Urine pregnancy negative.  Discussed supportive over-the-counter measures additionally.  Follow-up for worsening symptoms.  Final Clinical Impressions(s) / UC Diagnoses   Final diagnoses:  Vaginal discharge  Vaginal itching   Discharge Instructions   None    ED Prescriptions     Medication Sig Dispense Auth. Provider   fluconazole (DIFLUCAN) 150 MG tablet Take 1 tablet (150 mg total) by mouth daily. 1 tablet Particia Nearing, [place]   metroNIDAZOLE (FLAGYL) 500 MG tablet Take 1 tablet (500 mg total) by mouth 2 (two) times daily. 14 tablet Particia Nearing, [place]      PDMP not reviewed this encounter.   Particia Nearing, [place] 06/19/21 1334

## 2021-06-20 ENCOUNTER — Ambulatory Visit
Admission: EM | Admit: 2021-06-20 | Discharge: 2021-06-20 | Disposition: A | Discharge status: Home / Self Care | Payer: Self-pay | Attending: Internal Medicine | Admitting: Internal Medicine

## 2021-06-20 ENCOUNTER — Telehealth (HOSPITAL_COMMUNITY): Payer: Self-pay | Admitting: Emergency Medicine

## 2021-06-20 ENCOUNTER — Telehealth: Payer: Self-pay | Admitting: Emergency Medicine

## 2021-06-20 DIAGNOSIS — A549 Gonococcal infection, unspecified: Secondary | ICD-10-CM

## 2021-06-20 LAB — CERVICOVAGINAL ANCILLARY ONLY
Bacterial Vaginitis (gardnerella): NEGATIVE
Candida Glabrata: NEGATIVE
Candida Vaginitis: NEGATIVE
Chlamydia: NEGATIVE
Comment: NEGATIVE
Comment: NEGATIVE
Comment: NEGATIVE
Comment: NEGATIVE
Comment: NEGATIVE
Comment: NORMAL
Neisseria Gonorrhea: POSITIVE — AB
Trichomonas: POSITIVE — AB

## 2021-06-20 MED ORDER — FLUCONAZOLE 150 MG PO TABS
150.0000 mg | ORAL_TABLET | Freq: Every day | ORAL | 0 refills | Status: DC
Start: 2021-06-20 — End: 2022-11-05

## 2021-06-20 MED ORDER — CEFTRIAXONE SODIUM 500 MG IJ SOLR
500.0000 mg | Freq: Once | INTRAMUSCULAR | Status: AC
  Administered 2021-06-20: 500 mg via INTRAMUSCULAR

## 2021-06-20 MED ORDER — METRONIDAZOLE 500 MG PO TABS
500.0000 mg | ORAL_TABLET | Freq: Two times a day (BID) | ORAL | 0 refills | Status: DC
Start: 2021-06-20 — End: 2022-11-05

## 2021-06-20 NOTE — ED Triage Notes (Signed)
Pt presents for STD tx

## 2021-06-20 NOTE — Telephone Encounter (Signed)
PEr protocol, patient will need treatment with IM Rocephin 500mg  for positive Gonorrhea.  Already taking Flagyl

## 2021-07-06 ENCOUNTER — Ambulatory Visit
Admission: EM | Admit: 2021-07-06 | Discharge: 2021-07-06 | Disposition: A | Discharge status: Home / Self Care | Payer: Self-pay | Attending: Urgent Care | Admitting: Urgent Care

## 2021-07-06 ENCOUNTER — Other Ambulatory Visit: Payer: Self-pay

## 2021-07-06 DIAGNOSIS — Z8619 Personal history of other infectious and parasitic diseases: Secondary | ICD-10-CM | POA: Insufficient documentation

## 2021-07-06 DIAGNOSIS — N760 Acute vaginitis: Secondary | ICD-10-CM | POA: Insufficient documentation

## 2021-07-06 LAB — POCT URINALYSIS DIP (MANUAL ENTRY)
Bilirubin, UA: NEGATIVE
Glucose, UA: NEGATIVE mg/dL
Ketones, POC UA: NEGATIVE mg/dL
Leukocytes, UA: NEGATIVE
Nitrite, UA: NEGATIVE
Spec Grav, UA: 1.025 (ref 1.010–1.025)
Urobilinogen, UA: 1 E.U./dL
pH, UA: 7 (ref 5.0–8.0)

## 2021-07-06 LAB — POCT URINE PREGNANCY: Preg Test, Ur: NEGATIVE

## 2021-07-06 NOTE — ED Provider Notes (Signed)
[facility]   MRN: [id number] DOB: [birth year]  Subjective:   Betty Avila is a 25 y.o. female presenting for recheck on STIs.  Patient states that she was told to come back for recheck.  Has significant concerns about why she tested positive for gonorrhea and trichomoniasis when herpartner tested negative.  She still has some intermittent pelvic discomfort, clear secretions from the vaginal area.  Denies frank discharge, dysuria, urinary frequency, nausea, vomiting, abdominal pain.  LMP was this past week.  She is very anxious and concerned about her symptoms once to make sure that she gets checked for these kinds of infections again.  No current facility-administered medications for this encounter.  Current Outpatient Medications:    acetaminophen (TYLENOL) 325 MG tablet, Take 2 tablets (650 mg total) by mouth every 6 (six) hours., Disp: , Rfl:    coconut oil OIL, Apply 1 application topically as needed (nipple pain)., Disp: , Rfl: 0   docusate sodium (COLACE) 100 MG capsule, Take 1 capsule (100 mg total) by mouth 2 (two) times daily., Disp: 30 capsule, Rfl: 2   Ferrous Sulfate (IRON) 325 (65 Fe) MG TABS, Take 1 tablet (325 mg total) by mouth 2 times daily at 12 noon and 4 pm., Disp: 60 tablet, Rfl: 1   fluconazole (DIFLUCAN) 150 MG tablet, Take 1 tablet (150 mg total) by mouth daily., Disp: 1 tablet, Rfl: 0   ibuprofen (ADVIL) 600 MG tablet, Take 1 tablet (600 mg total) by mouth every 6 (six) hours., Disp: 30 tablet, Rfl: 0   metroNIDAZOLE (FLAGYL) 500 MG tablet, Take 1 tablet (500 mg total) by mouth 2 (two) times daily., Disp: 14 tablet, Rfl: 0   norethindrone (ORTHO MICRONOR) 0.35 MG tablet, Take 1 tablet (0.35 mg total) by mouth daily., Disp: 56 tablet, Rfl: 1   oxyCODONE (OXY IR/ROXICODONE) 5 MG immediate release tablet, Take 1 tablet (5 mg total) by mouth every 6 (six) hours as needed for breakthrough pain., Disp: 12 tablet, Rfl: 0   polyethylene glycol (MIRALAX  / GLYCOLAX) 17 g packet, Take 17 g by mouth daily as needed for moderate constipation., Disp: 30 each, Rfl: 1   Prenatal Vit-Fe Fumarate-FA (MULTIVITAMIN-PRENATAL) 27-0.8 MG TABS tablet, Take 1 tablet by mouth daily at 12 noon., Disp: , Rfl:    Allergies  Allergen Reactions   Latex     Past Medical History:  Diagnosis Date   Anemia    2017 Pregnancy   Anxiety    No med, saw counselor   Asthma    History of recurrent UTIs    Hypertension    BP elevated @ delivery, per client   Medical history non-contributory      Past Surgical History:  Procedure Laterality Date   Denies surgical history     NO PAST SURGERIES      History reviewed. No pertinent family history.  Social History   Tobacco Use   Smoking status: Former    Years: 1.00    Types: Cigarettes   Smokeless tobacco: Never   Tobacco comments:    cigar  Substance Use Topics   Alcohol use: Not Currently    Alcohol/week: 0.0 standard drinks    Comment: occasion   Drug use: No    ROS   Objective:   Vitals: BP 106/71 (BP Location: Left Arm)   Pulse (!) 59   Temp 98.2 F (36.8 C) (Oral)   Resp 18   LMP 06/26/2021 (Approximate)   SpO2 97%   Physical Exam  Constitutional:      General: She is not in acute distress.    Appearance: Normal appearance. She is well-developed. She is not ill-appearing, toxic-appearing or diaphoretic.  HENT:     Head: Normocephalic and atraumatic.     Nose: Nose normal.     Mouth/Throat:     Mouth: Mucous membranes are moist.     Pharynx: Oropharynx is clear.  Eyes:     General: No scleral icterus.       Right eye: No discharge.        Left eye: No discharge.     Extraocular Movements: Extraocular movements intact.     Conjunctiva/sclera: Conjunctivae normal.     Pupils: Pupils are equal, round, and reactive to light.  Cardiovascular:     Rate and Rhythm: Normal rate.  Pulmonary:     Effort: Pulmonary effort is normal.  Abdominal:     General: There is no  distension.     Palpations: There is no mass.     Tenderness: There is no abdominal tenderness. There is no right CVA tenderness, left CVA tenderness, guarding or rebound.  Skin:    General: Skin is warm and dry.  Neurological:     General: No focal deficit present.     Mental Status: She is alert and oriented to person, place, and time.  Psychiatric:     Comments: Anxious demeanor.    Results for orders placed or performed during the hospital encounter of 07/06/21 (from the past 24 hour(s))  POCT urine pregnancy     Status: None   Collection Time: 07/06/21 12:43 PM  Result Value Ref Range   Preg Test, Ur Negative Negative  POCT urinalysis dipstick     Status: Abnormal   Collection Time: 07/06/21 12:58 PM  Result Value Ref Range   Color, UA yellow yellow   Clarity, UA clear clear   Glucose, UA negative negative mg/dL   Bilirubin, UA negative negative   Ketones, POC UA negative negative mg/dL   Spec Grav, UA 0.947 0.962 - 1.025   Blood, UA moderate (A) negative   pH, UA 7.0 5.0 - 8.0   Protein Ur, POC trace (A) negative mg/dL   Urobilinogen, UA 1.0 0.2 or 1.0 E.U./dL   Nitrite, UA Negative Negative   Leukocytes, UA Negative Negative    Assessment and Plan :   PDMP not reviewed this encounter.  1. Acute vaginitis   2. History of trichomoniasis   3. History of gonorrhea     Patient had received an IM ceftriaxone injection, metronidazole as well which she states that she completed.  She did not take the Diflucan.  Does not want to take this currently as she is not worried about yeast infection.  STI labs pending, will treat as appropriate based off of those lab results. Counseled patient on potential for adverse effects with medications prescribed/recommended today, ER and return-to-clinic precautions discussed, patient verbalized understanding.    Wallis Bamberg, PA-C 07/06/21 1355

## 2021-07-06 NOTE — Discharge Instructions (Addendum)

## 2021-07-06 NOTE — ED Triage Notes (Signed)
Pt states she was here a few weeks ago for STI check that was (+). States provider asked her to return for followup. She has completed the prescribed treatment and continues to experience pelvic discomfort, more than usual watery secretions in the vagina, and urge to void more frequently. States just completed her cycle but noted some blood prior to cycle. States she is anxious and concerned and wants to be reevaluated/tested.

## 2021-07-07 LAB — CERVICOVAGINAL ANCILLARY ONLY
Bacterial Vaginitis (gardnerella): NEGATIVE
Candida Glabrata: NEGATIVE
Candida Vaginitis: NEGATIVE
Chlamydia: NEGATIVE
Comment: NEGATIVE
Comment: NEGATIVE
Comment: NEGATIVE
Comment: NEGATIVE
Comment: NEGATIVE
Comment: NORMAL
Neisseria Gonorrhea: NEGATIVE
Trichomonas: NEGATIVE

## 2021-07-29 ENCOUNTER — Ambulatory Visit
Admission: EM | Admit: 2021-07-29 | Discharge: 2021-07-29 | Disposition: A | Discharge status: Home / Self Care | Payer: Self-pay | Attending: Urgent Care | Admitting: Urgent Care

## 2021-07-29 ENCOUNTER — Ambulatory Visit: Admission: EM | Admit: 2021-07-29 | Discharge: 2021-07-29 | Discharge status: Against Medical Advice | Payer: Self-pay

## 2021-07-29 ENCOUNTER — Other Ambulatory Visit: Payer: Self-pay

## 2021-07-29 ENCOUNTER — Encounter: Payer: Self-pay | Admitting: Emergency Medicine

## 2021-07-29 DIAGNOSIS — N898 Other specified noninflammatory disorders of vagina: Secondary | ICD-10-CM | POA: Insufficient documentation

## 2021-07-29 DIAGNOSIS — Z7251 High risk heterosexual behavior: Secondary | ICD-10-CM | POA: Insufficient documentation

## 2021-07-29 NOTE — ED Provider Notes (Signed)
[facility]   MRN: [id number] DOB: 1996/05/27  Subjective:   Betty Avila is a 25 y.o. female presenting for 2 to 3-day history of acute onset recurrent vaginal discharge. Has had unprotected sex once in the past week.  Has a history of gonorrhea, trichomonas. Denies fever, n/v, abdominal pain, pelvic pain, rashes, dysuria, urinary frequency, hematuria, vaginal itching.    No current facility-administered medications for this encounter.  Current Outpatient Medications:    acetaminophen (TYLENOL) 325 MG tablet, Take 2 tablets (650 mg total) by mouth every 6 (six) hours., Disp: , Rfl:    coconut oil OIL, Apply 1 application topically as needed (nipple pain)., Disp: , Rfl: 0   Ferrous Sulfate (IRON) 325 (65 Fe) MG TABS, Take 1 tablet (325 mg total) by mouth 2 times daily at 12 noon and 4 pm., Disp: 60 tablet, Rfl: 1   fluconazole (DIFLUCAN) 150 MG tablet, Take 1 tablet (150 mg total) by mouth daily. (Patient not taking: Reported on 07/29/2021), Disp: 1 tablet, Rfl: 0   ibuprofen (ADVIL) 600 MG tablet, Take 1 tablet (600 mg total) by mouth every 6 (six) hours., Disp: 30 tablet, Rfl: 0   metroNIDAZOLE (FLAGYL) 500 MG tablet, Take 1 tablet (500 mg total) by mouth 2 (two) times daily. (Patient not taking: Reported on 07/29/2021), Disp: 14 tablet, Rfl: 0   norethindrone (ORTHO MICRONOR) 0.35 MG tablet, Take 1 tablet (0.35 mg total) by mouth daily., Disp: 56 tablet, Rfl: 1   oxyCODONE (OXY IR/ROXICODONE) 5 MG immediate release tablet, Take 1 tablet (5 mg total) by mouth every 6 (six) hours as needed for breakthrough pain. (Patient not taking: Reported on 07/29/2021), Disp: 12 tablet, Rfl: 0   polyethylene glycol (MIRALAX / GLYCOLAX) 17 g packet, Take 17 g by mouth daily as needed for moderate constipation., Disp: 30 each, Rfl: 1   Prenatal Vit-Fe Fumarate-FA (MULTIVITAMIN-PRENATAL) 27-0.8 MG TABS tablet, Take 1 tablet by mouth daily at 12 noon., Disp: , Rfl:    Allergies  Allergen  Reactions   Latex     Past Medical History:  Diagnosis Date   Anemia    2017 Pregnancy   Anxiety    No med, saw counselor   Asthma    History of recurrent UTIs    Hypertension    BP elevated @ delivery, per client   Medical history non-contributory      Past Surgical History:  Procedure Laterality Date   Denies surgical history     NO PAST SURGERIES      History reviewed. No pertinent family history.  Social History   Tobacco Use   Smoking status: Former    Years: 1.00    Types: Cigarettes   Smokeless tobacco: Never   Tobacco comments:    cigar  Substance Use Topics   Alcohol use: Not Currently    Alcohol/week: 0.0 standard drinks    Comment: occasion   Drug use: No    ROS   Objective:   Vitals: BP 109/70 (BP Location: Left Arm)   Pulse 69   Temp 98.1 F (36.7 C) (Oral)   Resp 18   SpO2 98%   Physical Exam Constitutional:      General: She is not in acute distress.    Appearance: Normal appearance. She is well-developed. She is not ill-appearing, toxic-appearing or diaphoretic.  HENT:     Head: Normocephalic and atraumatic.     Nose: Nose normal.     Mouth/Throat:     Mouth: Mucous membranes  are moist.     Pharynx: Oropharynx is clear.  Eyes:     General: No scleral icterus.       Right eye: No discharge.        Left eye: No discharge.     Extraocular Movements: Extraocular movements intact.     Conjunctiva/sclera: Conjunctivae normal.     Pupils: Pupils are equal, round, and reactive to light.  Cardiovascular:     Rate and Rhythm: Normal rate.  Pulmonary:     Effort: Pulmonary effort is normal.  Abdominal:     General: Bowel sounds are normal. There is no distension.     Palpations: Abdomen is soft. There is no mass.     Tenderness: There is no abdominal tenderness. There is no right CVA tenderness, left CVA tenderness, guarding or rebound.  Skin:    General: Skin is warm and dry.  Neurological:     General: No focal deficit present.      Mental Status: She is alert and oriented to person, place, and time.  Psychiatric:        Mood and Affect: Mood normal.        Behavior: Behavior normal.        Thought Content: Thought content normal.        Judgment: Judgment normal.    Recent Results (from the past 2160 hour(s))  Cervicovaginal ancillary only     Status: Abnormal   Collection Time: 06/19/21  1:33 PM  Result Value Ref Range   Neisseria Gonorrhea Positive (A)    Chlamydia Negative    Trichomonas Positive (A)    Bacterial Vaginitis (gardnerella) Negative    Candida Vaginitis Negative    Candida Glabrata Negative    Comment Normal Reference Range Candida Species - Negative    Comment Normal Reference Range Candida Galbrata - Negative    Comment Normal Reference Range Trichomonas - Negative    Comment Normal Reference Ranger Chlamydia - Negative    Comment      Normal Reference Range Neisseria Gonorrhea - Negative   Comment      Normal Reference Range Bacterial Vaginosis - Negative  Cervicovaginal ancillary only     Status: None   Collection Time: 07/06/21 12:42 PM  Result Value Ref Range   Bacterial Vaginitis (gardnerella) Negative    Candida Vaginitis Negative    Candida Glabrata Negative    Trichomonas Negative    Chlamydia Negative    Neisseria Gonorrhea Negative    Comment      Normal Reference Range Bacterial Vaginosis - Negative   Comment Normal Reference Range Candida Species - Negative    Comment Normal Reference Range Candida Galbrata - Negative    Comment Normal Reference Range Trichomonas - Negative    Comment Normal Reference Ranger Chlamydia - Negative    Comment      Normal Reference Range Neisseria Gonorrhea - Negative  POCT urine pregnancy     Status: None   Collection Time: 07/06/21 12:43 PM  Result Value Ref Range   Preg Test, Ur Negative Negative  POCT urinalysis dipstick     Status: Abnormal   Collection Time: 07/06/21 12:58 PM  Result Value Ref Range   Color, UA yellow yellow    Clarity, UA clear clear   Glucose, UA negative negative mg/dL   Bilirubin, UA negative negative   Ketones, POC UA negative negative mg/dL   Spec Grav, UA 4.580 9.983 - 1.025   Blood, UA moderate (A) negative  pH, UA 7.0 5.0 - 8.0   Protein Ur, POC trace (A) negative mg/dL   Urobilinogen, UA 1.0 0.2 or 1.0 E.U./dL   Nitrite, UA Negative Negative   Leukocytes, UA Negative Negative    Assessment and Plan :   PDMP not reviewed this encounter.  1. Vaginal discharge   2. Unprotected sex     Last STI check was negative.  Patient did not care for testing for oral or anal infections.  Does not want testing for HIV and syphilis.  We will treat for any infections as appropriate based off of the lab results.  Encouraged safe sex practices. Counseled patient on potential for adverse effects with medications prescribed/recommended today, ER and return-to-clinic precautions discussed, patient verbalized understanding.    Wallis Bamberg, [place] 07/30/21 [phone]

## 2021-07-29 NOTE — ED Triage Notes (Signed)
Pt here for STD screening; sts she is having discharge

## 2021-08-01 LAB — CERVICOVAGINAL ANCILLARY ONLY
Bacterial Vaginitis (gardnerella): NEGATIVE
Candida Glabrata: NEGATIVE
Candida Vaginitis: NEGATIVE
Chlamydia: NEGATIVE
Comment: NEGATIVE
Comment: NEGATIVE
Comment: NEGATIVE
Comment: NEGATIVE
Comment: NEGATIVE
Comment: NORMAL
Neisseria Gonorrhea: NEGATIVE
Trichomonas: NEGATIVE

## 2022-03-13 ENCOUNTER — Ambulatory Visit
Admission: EM | Admit: 2022-03-13 | Discharge: 2022-03-13 | Disposition: A | Discharge status: Home / Self Care | Payer: Self-pay | Attending: Internal Medicine | Admitting: Internal Medicine

## 2022-03-13 ENCOUNTER — Encounter: Payer: Self-pay | Admitting: Emergency Medicine

## 2022-03-13 DIAGNOSIS — N898 Other specified noninflammatory disorders of vagina: Secondary | ICD-10-CM

## 2022-03-13 DIAGNOSIS — Z113 Encounter for screening for infections with a predominantly sexual mode of transmission: Secondary | ICD-10-CM

## 2022-03-13 DIAGNOSIS — R8290 Unspecified abnormal findings in urine: Secondary | ICD-10-CM | POA: Insufficient documentation

## 2022-03-13 DIAGNOSIS — N941 Unspecified dyspareunia: Secondary | ICD-10-CM

## 2022-03-13 LAB — POCT URINE PREGNANCY: Preg Test, Ur: NEGATIVE

## 2022-03-13 LAB — POCT URINALYSIS DIP (MANUAL ENTRY)
Bilirubin, UA: NEGATIVE
Glucose, UA: NEGATIVE mg/dL
Ketones, POC UA: NEGATIVE mg/dL
Leukocytes, UA: NEGATIVE
Nitrite, UA: NEGATIVE
Spec Grav, UA: 1.025 (ref 1.010–1.025)
Urobilinogen, UA: 2 E.U./dL — AB
pH, UA: 6.5 (ref 5.0–8.0)

## 2022-03-13 MED ORDER — DOXYCYCLINE HYCLATE 100 MG PO CAPS: 100.0000 mg | ORAL_CAPSULE | Freq: Two times a day (BID) | ORAL | 0 refills | Status: DC

## 2022-03-13 MED ORDER — CEFTRIAXONE SODIUM 500 MG IJ SOLR
500.0000 mg | Freq: Once | INTRAMUSCULAR | Status: AC
  Administered 2022-03-13: 500 mg via INTRAMUSCULAR

## 2022-03-13 NOTE — Discharge Instructions (Signed)
Tests are pending.  We will call if anything is positive and treat as appropriate.  You have been given an antibiotic injection today in urgent care and sent antibiotic pills to the pharmacy.  We will call you if there is any change in treatment.  Please refrain from sexual activity until test results and treatment are complete. ?

## 2022-03-13 NOTE — ED Triage Notes (Signed)
Patient said x 1 month oder to urine but the vaginal discharge with light oder x 2 weeks. Painful sex 2 days ago ?

## 2022-03-13 NOTE — ED Provider Notes (Signed)
?[facility] ? ? ? ?CSN: [id number] ?Arrival date & time: 03/13/22  1820 ? ? ?  ? ?History   ?Chief Complaint ?Chief Complaint  ?Patient presents with  ? Vaginal Discharge  ? ? ?HPI ?Betty Avila is a 26 y.o. female.  ? ?Patient presents with malodorous urine, vaginal discharge, pain during intercourse.  Malodorous urine started approximately 1 month ago but patient denies urinary burning, urinary frequency, abdominal pain, pelvic pain, fever, back pain.  Discharge is clear to yellow in color and started approximately 2 weeks ago.  Pain during intercourse occurred 2 days ago.  Patient denies any irregular vaginal bleeding or hematuria.  Last menstrual cycle was a few weeks ago.  Denies any known exposure to STD but has had unprotected sexual intercourse recently. ? ? ?Vaginal Discharge ? ?Past Medical History:  ?Diagnosis Date  ? Anemia   ? 2017 Pregnancy  ? Anxiety   ? No med, saw counselor  ? Asthma   ? History of recurrent UTIs   ? Hypertension   ? BP elevated @ delivery, per client  ? Medical history non-contributory   ? ? ?Patient Active Problem List  ? Diagnosis Date Noted  ? Indication for care or intervention in labor or delivery 07/10/2020  ? 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 Rh negative status during pregnancy in second trimester, antepartum 02/03/2020  ? Supervision of other normal pregnancy, antepartum 02/02/2020  ? Late prenatal care 02/02/2020  ? NSVD (normal spontaneous vaginal delivery) 09/02/2016  ? ? ?Past Surgical History:  ?Procedure Laterality Date  ? Denies surgical history    ? NO PAST SURGERIES    ? ? ?OB History   ? ? Gravida  ?2  ? Para  ?2  ? Term  ?2  ? Preterm  ?   ? AB  ?   ? Living  ?2  ?  ? ? SAB  ?   ? IAB  ?   ? Ectopic  ?   ? Multiple  ?0  ? Live Births  ?2  ?   ?  ?  ? ? ? ?Home Medications   ? ?Prior to Admission medications   ?Medication Sig Start Date End Date Taking? Authorizing Provider  ?doxycycline (VIBRAMYCIN) 100 MG capsule Take 1  capsule (100 mg total) by mouth 2 (two) times daily. 03/13/22  Yes Gustavus Bryant, FNP  ?acetaminophen (TYLENOL) 325 MG tablet Take 2 tablets (650 mg total) by mouth every 6 (six) hours. 07/11/20   Sheila Oats, MD  ?coconut oil OIL Apply 1 application topically as needed (nipple pain). 07/11/20   Sheila Oats, MD  ?Ferrous Sulfate (IRON) 325 (65 Fe) MG TABS Take 1 tablet (325 mg total) by mouth 2 times daily at 12 noon and 4 pm. 07/11/20   Sheila Oats, MD  ?fluconazole (DIFLUCAN) 150 MG tablet Take 1 tablet (150 mg total) by mouth daily. ?Patient not taking: Reported on 07/29/2021 06/20/21   Particia Nearing, PA-C  ?ibuprofen (ADVIL) 600 MG tablet Take 1 tablet (600 mg total) by mouth every 6 (six) hours. 07/11/20   Sheila Oats, MD  ?metroNIDAZOLE (FLAGYL) 500 MG tablet Take 1 tablet (500 mg total) by mouth 2 (two) times daily. ?Patient not taking: Reported on 07/29/2021 06/20/21   Particia Nearing, PA-C  ?norethindrone (ORTHO MICRONOR) 0.35 MG tablet Take 1 tablet (0.35 mg total) by mouth daily. 07/11/20 07/11/21  Sheila Oats, MD  ?oxyCODONE (OXY IR/ROXICODONE) 5 MG  immediate release tablet Take 1 tablet (5 mg total) by mouth every 6 (six) hours as needed for breakthrough pain. ?Patient not taking: Reported on 07/29/2021 07/11/20   Sheila OatsGoswick, Anna E, MD  ?polyethylene glycol (MIRALAX / GLYCOLAX) 17 g packet Take 17 g by mouth daily as needed for moderate constipation. 07/11/20   Sheila OatsGoswick, Anna E, MD  ?Prenatal Vit-Fe Fumarate-FA (MULTIVITAMIN-PRENATAL) 27-0.8 MG TABS tablet Take 1 tablet by mouth daily at 12 noon.    [provider]  ? ? ?Family History ?History reviewed. No pertinent family history. ? ?Social History ?Social History  ? ?Tobacco Use  ? Smoking status: Former  ?  Years: 1.00  ?  Types: Cigarettes  ? Smokeless tobacco: Never  ? Tobacco comments:  ?  cigar  ?Substance Use Topics  ? Alcohol use: Not Currently  ?  Alcohol/week: 0.0 standard drinks  ?  Comment: occasion  ? Drug use:  No  ? ? ? ?Allergies   ?Latex ? ? ?Review of Systems ?Review of Systems ?Per HPI ? ?Physical Exam ?Triage Vital Signs ?ED Triage Vitals  ?Enc Vitals Group  ?   BP 03/13/22 1901 122/85  ?   Pulse Rate 03/13/22 1901 67  ?   Resp 03/13/22 1901 16  ?   Temp 03/13/22 1901 98.6 ?F (37 ?C)  ?   Temp Source 03/13/22 1901 Oral  ?   SpO2 03/13/22 1901 98 %  ?   Weight --   ?   Height --   ?   Head Circumference --   ?   Peak Flow --   ?   Pain Score 03/13/22 1859 0  ?   Pain Loc --   ?   Pain Edu? --   ?   Excl. in GC? --   ? ?No data found. ? ?Updated Vital Signs ?BP 122/85 (BP Location: Left Arm)   Pulse 67   Temp 98.6 ?F (37 ?C) (Oral)   Resp 16   SpO2 98%  ? ?Visual Acuity ?Right Eye Distance:   ?Left Eye Distance:   ?Bilateral Distance:   ? ?Right Eye Near:   ?Left Eye Near:    ?Bilateral Near:    ? ?Physical Exam ?Exam conducted with a chaperone present.  ?Constitutional:   ?   General: She is not in acute distress. ?   Appearance: Normal appearance. She is not toxic-appearing or diaphoretic.  ?HENT:  ?   Head: Normocephalic and atraumatic.  ?Eyes:  ?   Extraocular Movements: Extraocular movements intact.  ?   Conjunctiva/sclera: Conjunctivae normal.  ?Cardiovascular:  ?   Rate and Rhythm: Normal rate and regular rhythm.  ?   Pulses: Normal pulses.  ?   Heart sounds: Normal heart sounds.  ?Pulmonary:  ?   Effort: Pulmonary effort is normal. No respiratory distress.  ?   Breath sounds: Normal breath sounds.  ?Genitourinary: ?   Comments: Clear to white vaginal discharge present on exam.  No adnexal tenderness or cervical motional tenderness.  No cervical friability. ?Neurological:  ?   General: No focal deficit present.  ?   Mental Status: She is alert and oriented to person, place, and time. Mental status is at baseline.  ?Psychiatric:     ?   Mood and Affect: Mood normal.     ?   Behavior: Behavior normal.     ?   Thought Content: Thought content normal.     ?   Judgment: Judgment normal.  ? ? ? ?  UC Treatments /  Results  ?Labs ?(all labs ordered are listed, but only abnormal results are displayed) ?Labs Reviewed  ?POCT URINALYSIS DIP (MANUAL ENTRY) - Abnormal; Notable for the following components:  ?    Result Value  ? Blood, UA moderate (*)   ? Protein Ur, POC trace (*)   ? Urobilinogen, UA 2.0 (*)   ? All other components within normal limits  ?URINE CULTURE  ?CBC  ?COMPREHENSIVE METABOLIC PANEL  ?POCT URINE PREGNANCY  ?CERVICOVAGINAL ANCILLARY ONLY  ? ? ?EKG ? ? ?Radiology ?No results found. ? ?Procedures ?Procedures (including critical care time) ? ?Medications Ordered in UC ?Medications  ?cefTRIAXone (ROCEPHIN) injection 500 mg (500 mg Intramuscular Given 03/13/22 1938)  ? ? ?Initial Impression / Assessment and Plan / UC Course  ?I have reviewed the triage vital signs and the nursing notes. ? ?Pertinent labs & imaging results that were available during my care of the patient were reviewed by me and considered in my medical decision making (see chart for details). ? ?  ? ?There is concern for possible cervicitis given pain during intercourse.  Will opt to treat with IM Rocephin and doxycycline to cover for STIs in urgent care today in case cervicitis is present.  Cervicovaginal swab is pending.  Urinalysis not indicating urinary tract infection but did show some abnormalities so CMP and CBC are pending to rule out other worrisome etiologies related to this.  Urine culture is pending as well.  Will await results for any further treatment at this time.  Urine pregnancy was negative.  Patient was given strict return and ER precautions.  Patient to refrain from sexual activity until test results and treatment are complete.  Patient verbalized understanding and was agreeable with plan. ?Final Clinical Impressions(s) / UC Diagnoses  ? ?Final diagnoses:  ?Malodorous urine  ?Pain in female genitalia on intercourse  ?Vaginal discharge  ?Screening examination for venereal disease  ? ? ? ?Discharge Instructions   ? ?  ?Tests are  pending.  We will call if anything is positive and treat as appropriate.  You have been given an antibiotic injection today in urgent care and sent antibiotic pills to the pharmacy.  We will call you if there

## 2022-03-15 LAB — CERVICOVAGINAL ANCILLARY ONLY
Bacterial Vaginitis (gardnerella): NEGATIVE
Candida Glabrata: NEGATIVE
Candida Vaginitis: NEGATIVE
Chlamydia: NEGATIVE
Comment: NEGATIVE
Comment: NEGATIVE
Comment: NEGATIVE
Comment: NEGATIVE
Comment: NEGATIVE
Comment: NORMAL
Neisseria Gonorrhea: NEGATIVE
Trichomonas: NEGATIVE

## 2022-03-15 LAB — CBC
Hematocrit: 37 % (ref 34.0–46.6)
Hemoglobin: 12.5 g/dL (ref 11.1–15.9)
MCH: 31.3 pg (ref 26.6–33.0)
MCHC: 33.8 g/dL (ref 31.5–35.7)
MCV: 93 fL (ref 79–97)
Platelets: 285 10*3/uL (ref 150–450)
RBC: 4 x10E6/uL (ref 3.77–5.28)
RDW: 12.6 % (ref 11.7–15.4)
WBC: 8.2 10*3/uL (ref 3.4–10.8)

## 2022-03-15 LAB — COMPREHENSIVE METABOLIC PANEL
ALT: 21 IU/L (ref 0–32)
AST: 21 IU/L (ref 0–40)
Albumin/Globulin Ratio: 1.8 (ref 1.2–2.2)
Albumin: 4.8 g/dL (ref 3.9–5.0)
Alkaline Phosphatase: 85 IU/L (ref 44–121)
BUN/Creatinine Ratio: 20 (ref 9–23)
BUN: 15 mg/dL (ref 6–20)
Bilirubin Total: <0.2 mg/dL (ref 0.0–1.2)
CO2: 23 mmol/L (ref 20–29)
Calcium: 9.7 mg/dL (ref 8.7–10.2)
Chloride: 102 mmol/L (ref 96–106)
Creatinine, Ser: 0.74 mg/dL (ref 0.57–1.00)
Globulin, Total: 2.6 g/dL (ref 1.5–4.5)
Glucose: 91 mg/dL (ref 70–99)
Potassium: 3.7 mmol/L (ref 3.5–5.2)
Sodium: 139 mmol/L (ref 134–144)
Total Protein: 7.4 g/dL (ref 6.0–8.5)
eGFR: 116 mL/min/{1.73_m2} (ref >59)

## 2022-03-16 ENCOUNTER — Telehealth (HOSPITAL_COMMUNITY): Payer: Self-pay | Admitting: Emergency Medicine

## 2022-03-16 LAB — URINE CULTURE: Culture: 30000 — AB

## 2022-03-16 MED ORDER — NITROFURANTOIN MONOHYD MACRO 100 MG PO CAPS: 100.0000 mg | ORAL_CAPSULE | Freq: Two times a day (BID) | ORAL | 0 refills | Status: AC

## 2022-04-12 ENCOUNTER — Ambulatory Visit
Admission: EM | Admit: 2022-04-12 | Discharge: 2022-04-12 | Disposition: A | Discharge status: Home / Self Care | Payer: Self-pay | Attending: Emergency Medicine | Admitting: Emergency Medicine

## 2022-04-12 DIAGNOSIS — R319 Hematuria, unspecified: Secondary | ICD-10-CM | POA: Insufficient documentation

## 2022-04-12 DIAGNOSIS — R350 Frequency of micturition: Secondary | ICD-10-CM | POA: Insufficient documentation

## 2022-04-12 LAB — POCT URINALYSIS DIP (MANUAL ENTRY)
Bilirubin, UA: NEGATIVE
Glucose, UA: NEGATIVE mg/dL
Ketones, POC UA: NEGATIVE mg/dL
Leukocytes, UA: NEGATIVE
Nitrite, UA: NEGATIVE
Protein Ur, POC: NEGATIVE mg/dL
Spec Grav, UA: >=1.03 — AB (ref 1.010–1.025)
Urobilinogen, UA: 0.2 E.U./dL
pH, UA: 6 (ref 5.0–8.0)

## 2022-04-12 LAB — POCT URINE PREGNANCY: Preg Test, Ur: NEGATIVE

## 2022-04-12 NOTE — ED Provider Notes (Signed)
[facility]    CSN: [id number] Arrival date & time: 04/12/22  1922     History   Chief Complaint Chief Complaint  Patient presents with   Hematuria    HPI Betty Avila is a 26 y.o. female.  Unclear history. Some red tinged urine the last 2 days. Some feelings of incomplete emptying. No dysuria, urgency. Patient has had heavy periods but states last cycle ended last week. Sexually active, no known exposure to STDs. Discharge has "sweaty" smell. No color change. Denies fever, abdominal pain, flank pain.  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 Authorizing Provider  acetaminophen (TYLENOL) 325 MG tablet Take 2 tablets (650 mg total) by mouth every 6 (six) hours. 07/11/20   Sheila Oats, MD  coconut oil OIL Apply 1 application topically as needed (nipple pain). 07/11/20   Sheila Oats, MD  Ferrous Sulfate (IRON) 325 (65 Fe) MG TABS Take 1  tablet (325 mg total) by mouth 2 times daily at 12 noon and 4 pm. 07/11/20   Sheila Oats, MD  fluconazole (DIFLUCAN) 150 MG tablet Take 1 tablet (150 mg total) by mouth daily. Patient not taking: Reported on 07/29/2021 06/20/21   Particia Nearing, PA-C  ibuprofen (ADVIL) 600 MG tablet Take 1 tablet (600 mg total) by mouth every 6 (six) hours. 07/11/20   Sheila Oats, MD  metroNIDAZOLE (FLAGYL) 500 MG tablet Take 1 tablet (500 mg total) by mouth 2 (two) times daily. Patient not taking: Reported on 07/29/2021 06/20/21   Particia Nearing, PA-C  nitrofurantoin, macrocrystal-monohydrate, (MACROBID) 100 MG capsule Take 1 capsule (100 mg total) by mouth 2 (two) times daily. 03/16/22   LampteyBritta Mccreedy, MD  norethindrone (ORTHO MICRONOR) 0.35 MG tablet Take 1 tablet (0.35 mg total) by mouth daily. 07/11/20 07/11/21  Sheila Oats, MD  oxyCODONE (OXY IR/ROXICODONE) 5 MG immediate release tablet Take 1 tablet (5 mg total) by mouth every 6 (six) hours as needed for breakthrough pain. Patient not taking: Reported on 07/29/2021 07/11/20   Sheila Oats, MD  polyethylene glycol [facility] /  GLYCOLAX) 17 g packet Take 17 g by mouth daily as needed for moderate constipation. 07/11/20   Sheila Oats, MD  Prenatal Vit-Fe Fumarate-FA (MULTIVITAMIN-PRENATAL) 27-0.8 MG TABS tablet Take 1 tablet by mouth daily at 12 noon.    [provider]    Family History No family history on file.  Social History Social History   Tobacco Use   Smoking status: Former    Years: 1.00    Types: Cigarettes   Smokeless tobacco: Never   Tobacco comments:    cigar  Substance Use Topics   Alcohol use: Not Currently    Alcohol/week: 0.0 standard drinks    Comment: occasion   Drug use: No     Allergies   Latex   Review of Systems Review of Systems  Genitourinary:  Positive for hematuria.   As per HPI  Physical Exam Triage Vital Signs ED Triage Vitals  Enc Vitals Group     BP 04/12/22 1943  104/65     Pulse Rate 04/12/22 1943 72     Resp 04/12/22 1943 18     Temp 04/12/22 1943 98 F (36.7 C)     Temp src --      SpO2 04/12/22 1943 98 %     Weight --      Height --      Head Circumference --      Peak Flow --      Pain Score 04/12/22 1942 0     Pain Loc --      Pain Edu? --      Excl. in GC? --    No data found.  Updated Vital Signs BP 104/65 (BP Location: Right Arm)   Pulse 72   Temp 98 F (36.7 C)   Resp 18   LMP 04/03/2022   SpO2 98%    Physical Exam Vitals and nursing note reviewed.  Cardiovascular:     Rate and Rhythm: Normal rate and regular rhythm.     Heart sounds: Normal heart sounds.  Pulmonary:     Effort: Pulmonary effort is normal.     Breath sounds: Normal breath sounds.  Abdominal:     General: Bowel sounds are normal.     Tenderness: There is no abdominal tenderness. There is no right CVA tenderness or left CVA tenderness.  Genitourinary:    Comments: deferred Neurological:     Mental Status: She is alert and oriented to person, place, and time.     UC Treatments / Results  Labs (all labs ordered are listed, but only abnormal results are displayed) Labs Reviewed  POCT URINALYSIS DIP (MANUAL ENTRY) - Abnormal; Notable for the following components:      Result Value   Spec Grav, UA >=1.030 (*)    Blood, UA moderate (*)    All other components within normal limits  URINE CULTURE  POCT URINE PREGNANCY  CERVICOVAGINAL ANCILLARY ONLY   EKG  Radiology No results found.  Procedures Procedures (including critical care time)  Medications Ordered in UC Medications - No data to display  Initial Impression / Assessment and Plan / UC Course  I have reviewed the triage vital signs and the nursing notes.  Pertinent labs & imaging results that were available during my care of the patient were reviewed by me and considered in my medical decision making (see chart for details).  Urinalysis with moderate red cells, high specific  gravity. Unclear etiology of hematuria. Culture pending. Recommend patient establish with a  primary care and follow up with them for further evaluation. [facility] Patient should drink plenty of fluids. Cytology pending at this time. We will notify her if anything returns positive and treat accordingly.  Return precautions discussed. Patient agrees to plan and is discharged in stable condition.  Final Clinical Impressions(s) / UC Diagnoses   Final diagnoses:  Hematuria, unspecified type     Discharge Instructions      Please follow up with primary care. I have attached their contact information.     ED Prescriptions   None    PDMP not reviewed this encounter.   Vamsi Apfel, Lurena JoinerRebecca, [place] 04/12/22 2015

## 2022-04-12 NOTE — ED Triage Notes (Signed)
Patient presents to Urgent Care with complaints of hematuria since earlier this week.

## 2022-04-12 NOTE — Discharge Instructions (Signed)
Please follow up with primary care. I have attached their contact information.

## 2022-04-13 ENCOUNTER — Ambulatory Visit: Payer: Self-pay

## 2022-04-13 NOTE — Telephone Encounter (Signed)
  Chief Complaint: hematuria Symptoms: hematuria not constant when urinating but present in lab results  Frequency: 2-3 days  Pertinent Negatives: Patient denies dysuria or discomfort  Disposition: [] ED /[] Urgent Care (no appt availability in office) / [x] Appointment(In office/virtual)/ []  [facility]/ [] Home Care/ [] Refused Recommended Disposition /[] [facility]/ []  Follow-up with PCP Additional Notes: pt went to UC yesterday and was advised she may need urology referral so was calling to see if theres anything else she needed to do prior to coming in for appt or if she can take something to help with the hematuria. I scheduled pt for hosp fu appt on 04/18/22 at 1000 d/t first available appt.   Reason for Disposition  Blood in urine  (Exception: could be normal menstrual bleeding)  Answer Assessment - Initial Assessment Questions 1. COLOR of URINE: "Describe the color of the urine."  (e.g., tea-colored, pink, red, blood clots, bloody)     Red tinged  2. ONSET: "When did the bleeding start?"      2-3 days  3. EPISODES: "How many times has there been blood in the urine?" or "How many times today?"     Intermittent  4. PAIN with URINATION: "Is there any pain with passing your urine?" If Yes, ask: "How bad is the pain?"  (Scale 1-10; or mild, moderate, severe)    - MILD - complains slightly about urination hurting    - MODERATE - interferes with normal activities      - SEVERE - excruciating, unwilling or unable to urinate because of the pain      No 8. PREGNANCY: "Is there any chance you are pregnant?" "When was your last menstrual period?"     Period in 1 week  Protocols used: Urine - Blood In-A-AH

## 2022-04-14 LAB — URINE CULTURE: Culture: NO GROWTH

## 2022-04-16 LAB — CERVICOVAGINAL ANCILLARY ONLY
Bacterial Vaginitis (gardnerella): NEGATIVE
Candida Glabrata: NEGATIVE
Candida Vaginitis: NEGATIVE
Chlamydia: NEGATIVE
Comment: NEGATIVE
Comment: NEGATIVE
Comment: NEGATIVE
Comment: NEGATIVE
Comment: NEGATIVE
Comment: NORMAL
Neisseria Gonorrhea: NEGATIVE
Trichomonas: NEGATIVE

## 2022-04-18 ENCOUNTER — Encounter: Payer: Self-pay | Admitting: Family Medicine

## 2022-04-18 ENCOUNTER — Ambulatory Visit (INDEPENDENT_AMBULATORY_CARE_PROVIDER_SITE_OTHER): Payer: Self-pay | Admitting: Family Medicine

## 2022-04-18 VITALS — BP 99/64 | HR 66 | Temp 97.4°F | Resp 16 | Ht 68.0 in | Wt 138.0 lb

## 2022-04-18 DIAGNOSIS — R109 Unspecified abdominal pain: Secondary | ICD-10-CM

## 2022-04-18 DIAGNOSIS — R300 Dysuria: Secondary | ICD-10-CM

## 2022-04-18 DIAGNOSIS — Z7689 Persons encountering health services in other specified circumstances: Secondary | ICD-10-CM

## 2022-04-18 LAB — POCT URINALYSIS DIP (CLINITEK)
Bilirubin, UA: NEGATIVE
Glucose, UA: NEGATIVE mg/dL
Ketones, POC UA: NEGATIVE mg/dL
Leukocytes, UA: NEGATIVE
Nitrite, UA: NEGATIVE
POC PROTEIN,UA: 30 — AB
Spec Grav, UA: >=1.03 — AB (ref 1.010–1.025)
Urobilinogen, UA: 0.2 E.U./dL
pH, UA: 6 (ref 5.0–8.0)

## 2022-04-18 MED ORDER — PAROXETINE HCL 20 MG PO TABS: 20.0000 mg | ORAL_TABLET | Freq: Every day | ORAL | 1 refills | Status: AC

## 2022-04-18 MED ORDER — SULFAMETHOXAZOLE-TRIMETHOPRIM 800-160 MG PO TABS: 1.0000 | ORAL_TABLET | Freq: Two times a day (BID) | ORAL | 0 refills | Status: DC

## 2022-04-18 NOTE — Progress Notes (Unsigned)
Patient is here to est care . Patient is having issues with here with back pain. Patient would like to talk wit provider.

## 2022-04-19 NOTE — Progress Notes (Signed)
New Patient Office Visit  Subjective    Patient ID: Betty Avila, female    DOB: [birth year]  Age: 26 y.o. MRN: [id number]  CC: No chief complaint on file.   HPI Betty Avila presents to establish care and for complaint of left sided back pain. Patient reports that she has also had pain with urination and believes that she has a UTI. She denies known trauma or injury. Patient denies fever/chills. She reports that she has had UTI which she did not pick up the abx to treat 2/2 $$.   Outpatient Encounter Medications as of 04/18/2022  Medication Sig   PARoxetine (PAXIL) 20 MG tablet Take 1 tablet (20 mg total) by mouth daily.   sulfamethoxazole-trimethoprim (BACTRIM DS) 800-160 MG tablet Take 1 tablet by mouth 2 (two) times daily.   acetaminophen (TYLENOL) 325 MG tablet Take 2 tablets (650 mg total) by mouth every 6 (six) hours.   coconut oil OIL Apply 1 application topically as needed (nipple pain).   Ferrous Sulfate (IRON) 325 (65 Fe) MG TABS Take 1 tablet (325 mg total) by mouth 2 times daily at 12 noon and 4 pm.   fluconazole (DIFLUCAN) 150 MG tablet Take 1 tablet (150 mg total) by mouth daily. (Patient not taking: Reported on 07/29/2021)   ibuprofen (ADVIL) 600 MG tablet Take 1 tablet (600 mg total) by mouth every 6 (six) hours.   metroNIDAZOLE (FLAGYL) 500 MG tablet Take 1 tablet (500 mg total) by mouth 2 (two) times daily. (Patient not taking: Reported on 07/29/2021)   nitrofurantoin, macrocrystal-monohydrate, (MACROBID) 100 MG capsule Take 1 capsule (100 mg total) by mouth 2 (two) times daily.   norethindrone (ORTHO MICRONOR) 0.35 MG tablet Take 1 tablet (0.35 mg total) by mouth daily.   oxyCODONE (OXY IR/ROXICODONE) 5 MG immediate release tablet Take 1 tablet (5 mg total) by mouth every 6 (six) hours as needed for breakthrough pain. (Patient not taking: Reported on 07/29/2021)   polyethylene glycol (MIRALAX / GLYCOLAX) 17 g packet Take 17 g by mouth daily as needed for  moderate constipation.   Prenatal Vit-Fe Fumarate-FA (MULTIVITAMIN-PRENATAL) 27-0.8 MG TABS tablet Take 1 tablet by mouth daily at 12 noon.   No facility-administered encounter medications on file as of 04/18/2022.    Past Medical History:  Diagnosis Date   Anemia    2017 Pregnancy   Anxiety    No med, saw counselor   Asthma    History of recurrent UTIs    Hypertension    BP elevated @ delivery, per client   Medical history non-contributory     Past Surgical History:  Procedure Laterality Date   Denies surgical history     NO PAST SURGERIES      History reviewed. No pertinent family history.  Social History   Socioeconomic History   Marital status: Single    Spouse name: Not on file   Number of children: Not on file   Years of education: Not on file   Highest education level: Not on file  Occupational History   Not on file  Tobacco Use   Smoking status: Former    Years: 1.00    Types: Cigarettes   Smokeless tobacco: Never   Tobacco comments:    cigar  Substance and Sexual Activity   Alcohol use: Not Currently    Alcohol/week: 0.0 standard drinks    Comment: occasion   Drug use: No   Sexual activity: Yes  Other Topics Concern   Not on file  Social History Narrative   Not on file   Social Determinants of Health   Financial Resource Strain: Not on file  Food Insecurity: Not on file  Transportation Needs: Not on file  Physical Activity: Not on file  Stress: Not on file  Social Connections: Not on file  Intimate Partner Violence: Not on file    Review of Systems  Constitutional:  Negative for fever.  Genitourinary:  Positive for dysuria and flank pain.  All other systems reviewed and are negative.      Objective    BP 99/64   Pulse 66   Temp (!) 97.4 F (36.3 C) (Oral)   Resp 16   Ht 5\' 8"  (1.727 m)   Wt 138 lb (62.6 kg)   LMP 04/03/2022   SpO2 94%   BMI 20.98 kg/m   Physical Exam Vitals and nursing note reviewed.  Constitutional:       General: She is not in acute distress. Cardiovascular:     Rate and Rhythm: Normal rate and regular rhythm.  Pulmonary:     Effort: Pulmonary effort is normal.     Breath sounds: Normal breath sounds.  Abdominal:     Palpations: Abdomen is soft.     Tenderness: There is no abdominal tenderness. There is left CVA tenderness.  Neurological:     General: No focal deficit present.     Mental Status: She is alert and oriented to person, place, and time.        Assessment & Plan:   1. Dysuria Bactrim prescribed. Adequate and appropriate fluids recommended. - POCT URINALYSIS DIP (CLINITEK)  2. Left flank pain ? 2/2 above  3. Encounter to establish care       Return in about 4 weeks (around 05/16/2022) for follow up.   05/18/2022, MD

## 2022-05-01 ENCOUNTER — Ambulatory Visit: Payer: Self-pay | Admitting: *Deleted

## 2022-05-01 NOTE — Telephone Encounter (Signed)
  Chief Complaint: nausea, lack of appetite  Symptoms: nausea, lack of appetite, urinary discomfort Frequency: nausea started with medication- patient started antibiotic and antidepressive at same time- could be related to medication vs UTI not resolved Pertinent Negatives: Patient denies vomiting, fever Disposition: [] ED /[] Urgent Care (no appt availability in office) / [] Appointment(In office/virtual)/ []  [facility]/ [] Home Care/ [] Refused Recommended Disposition /[x] [facility]/ []  Follow-up with PCP Additional Notes: No open appointment- advised mobile unit/UC as options

## 2022-05-01 NOTE — Telephone Encounter (Signed)
Has been noted 

## 2022-05-01 NOTE — Telephone Encounter (Signed)
Summary: Symptoms - nurse advice   Pt called back to report that she is still experiencing symptoms even after completing Rx. Wants advice from a nurse, please advise   Best contact: [phone]      Reason for Disposition  Taking prescription medication that could cause nausea (e.g., narcotics/opiates, antibiotics, OCPs, many others)  Answer Assessment - Initial Assessment Questions 1. NAUSEA SEVERITY: "How bad is the nausea?" (e.g., mild, moderate, severe; dehydration, weight loss)   - MILD: loss of appetite without change in eating habits   - MODERATE: decreased oral intake without significant weight loss, dehydration, or malnutrition   - SEVERE: inadequate caloric or fluid intake, significant weight loss, symptoms of dehydration     mild 2. ONSET: "When did the nausea begin?"     Started with medication 3. VOMITING: "Any vomiting?" If Yes, ask: "How many times today?"     No- nausea only 4. RECURRENT SYMPTOM: "Have you had nausea before?" If Yes, ask: "When was the last time?" "What happened that time?"     no 5. CAUSE: "What do you think is causing the nausea?"     Possible medication  6. PREGNANCY: "Is there any chance you are pregnant?" (e.g., unprotected intercourse, missed birth control pill, broken condom)  Protocols used: Nausea-A-AH

## 2022-05-24 ENCOUNTER — Ambulatory Visit: Payer: Self-pay | Admitting: Family Medicine

## 2022-07-18 ENCOUNTER — Ambulatory Visit: Payer: Self-pay | Admitting: Family Medicine

## 2022-11-01 ENCOUNTER — Other Ambulatory Visit: Payer: Self-pay

## 2022-11-01 ENCOUNTER — Ambulatory Visit
Admission: EM | Admit: 2022-11-01 | Discharge: 2022-11-01 | Disposition: A | Discharge status: Home / Self Care | Payer: Self-pay | Attending: Internal Medicine | Admitting: Internal Medicine

## 2022-11-01 ENCOUNTER — Encounter: Payer: Self-pay | Admitting: Emergency Medicine

## 2022-11-01 DIAGNOSIS — R300 Dysuria: Secondary | ICD-10-CM | POA: Insufficient documentation

## 2022-11-01 DIAGNOSIS — Z113 Encounter for screening for infections with a predominantly sexual mode of transmission: Secondary | ICD-10-CM | POA: Insufficient documentation

## 2022-11-01 DIAGNOSIS — N898 Other specified noninflammatory disorders of vagina: Secondary | ICD-10-CM | POA: Insufficient documentation

## 2022-11-01 LAB — POCT URINALYSIS DIP (MANUAL ENTRY)
Bilirubin, UA: NEGATIVE
Glucose, UA: NEGATIVE mg/dL
Ketones, POC UA: NEGATIVE mg/dL
Leukocytes, UA: NEGATIVE
Nitrite, UA: NEGATIVE
Protein Ur, POC: NEGATIVE mg/dL
Spec Grav, UA: >=1.03 — AB (ref 1.010–1.025)
Urobilinogen, UA: 0.2 E.U./dL
pH, UA: 7 (ref 5.0–8.0)

## 2022-11-01 LAB — POCT URINE PREGNANCY: Preg Test, Ur: NEGATIVE

## 2022-11-01 NOTE — Discharge Instructions (Signed)
Your urine was unremarkable.  Urine pregnancy test was negative.  Vaginal swab is pending.  We will call if it is abnormal and treat as appropriate.  Please refrain from sexual activity until test results and treatment are complete.

## 2022-11-01 NOTE — ED Triage Notes (Signed)
Pt here for STD check with foul odor and discharge; pt sts some burning with urination

## 2022-11-01 NOTE — ED Provider Notes (Addendum)
[facility]    CSN: [id number] Arrival date & time: 11/01/22  1251      History   Chief Complaint Chief Complaint  Patient presents with   Exposure to STD    HPI Betty Avila is a 26 y.o. female.   Patient is a poor historian but is reporting vaginal discharge, vaginal odor, dysuria.  Patient is not sure exactly when symptoms started but thinks that it was a few days ago.  She does report that she had a urinary tract infection a few months prior but did not receive treatment given that she could not afford it.  She states those symptoms resolved, and these are new symptoms.  Vaginal discharge is clear to yellow in color.  She denies any confirmed exposure to STD but does report that her boyfriend is having similar symptoms.  She has had unprotected sexual intercourse with him.  Last menstrual cycle was mid November and she does not use any form of birth control.  Denies abdominal pain, pelvic pain, back pain, fever, abnormal vaginal bleeding, hematuria, urinary frequency.   Exposure to STD    Past Medical History:  Diagnosis Date   Anemia    2017 Pregnancy   Anxiety    No med, saw counselor   Asthma    History of recurrent UTIs    Hypertension    BP elevated @ delivery, per client   Medical history non-contributory     Patient Active Problem List   Diagnosis Date Noted   Urinary frequency 04/12/2022   Indication for care or intervention in labor or delivery 07/10/2020   HSV-2 infection complicating pregnancy [id number]   Rh negative status during pregnancy in second trimester, antepartum 02/03/2020   Supervision of other normal pregnancy, antepartum 02/02/2020   Late prenatal care 02/02/2020   NSVD (normal spontaneous vaginal delivery) 09/02/2016    Past Surgical History:  Procedure Laterality Date   Denies surgical history     NO PAST SURGERIES      OB History     Gravida  2   Para  2   Term  2   Preterm      AB      Living   2      SAB      IAB      Ectopic      Multiple  0   Live Births  2            Home Medications    Prior to Admission medications   Medication Sig Start Date End Date Taking? Authorizing Provider  acetaminophen (TYLENOL) 325 MG tablet Take 2 tablets (650 mg total) by mouth every 6 (six) hours. 07/11/20   Randa Ngo, MD  coconut oil OIL Apply 1 application topically as needed (nipple pain). 07/11/20   Randa Ngo, MD  Ferrous Sulfate (IRON) 325 (65 Fe) MG TABS Take 1 tablet (325 mg total) by mouth 2 times daily at 12 noon and 4 pm. 07/11/20   Randa Ngo, MD  fluconazole (DIFLUCAN) 150 MG tablet Take 1 tablet (150 mg total) by mouth daily. Patient not taking: Reported on 07/29/2021 06/20/21   Volney American, PA-C  ibuprofen (ADVIL) 600 MG tablet Take 1 tablet (600 mg total) by mouth every 6 (six) hours. 07/11/20   Randa Ngo, MD  metroNIDAZOLE (FLAGYL) 500 MG tablet Take 1 tablet (500 mg total) by mouth 2 (two) times daily. Patient not taking: Reported on 07/29/2021 06/20/21  Particia Nearing, PA-C  nitrofurantoin, macrocrystal-monohydrate, (MACROBID) 100 MG capsule Take 1 capsule (100 mg total) by mouth 2 (two) times daily. Patient not taking: Reported on 11/01/2022 03/16/22   Merrilee Jansky, MD  norethindrone (ORTHO MICRONOR) 0.35 MG tablet Take 1 tablet (0.35 mg total) by mouth daily. 07/11/20 07/11/21  Sheila Oats, MD  oxyCODONE (OXY IR/ROXICODONE) 5 MG immediate release tablet Take 1 tablet (5 mg total) by mouth every 6 (six) hours as needed for breakthrough pain. Patient not taking: Reported on 07/29/2021 07/11/20   Sheila Oats, MD  PARoxetine (PAXIL) 20 MG tablet Take 1 tablet (20 mg total) by mouth daily. 04/18/22   Georganna Skeans, MD  polyethylene glycol (MIRALAX / GLYCOLAX) 17 g packet Take 17 g by mouth daily as needed for moderate constipation. 07/11/20   Sheila Oats, MD  Prenatal Vit-Fe Fumarate-FA (MULTIVITAMIN-PRENATAL) 27-0.8 MG TABS  tablet Take 1 tablet by mouth daily at 12 noon.    [provider]  sulfamethoxazole-trimethoprim (BACTRIM DS) 800-160 MG tablet Take 1 tablet by mouth 2 (two) times daily. Patient not taking: Reported on 11/01/2022 04/18/22   Georganna Skeans, MD    Family History History reviewed. No pertinent family history.  Social History Social History   Tobacco Use   Smoking status: Former    Years: 1.00    Types: Cigarettes   Smokeless tobacco: Never   Tobacco comments:    cigar  Substance Use Topics   Alcohol use: Not Currently    Alcohol/week: 0.0 standard drinks of alcohol    Comment: occasion   Drug use: No     Allergies   Latex   Review of Systems Review of Systems Per HPI  Physical Exam Triage Vital Signs ED Triage Vitals  Enc Vitals Group     BP 11/01/22 1356 100/65     Pulse Rate 11/01/22 1356 64     Resp 11/01/22 1356 18     Temp 11/01/22 1356 99.2 F (37.3 C)     Temp Source 11/01/22 1356 Oral     SpO2 11/01/22 1356 98 %     Weight --      Height --      Head Circumference --      Peak Flow --      Pain Score 11/01/22 1357 0     Pain Loc --      Pain Edu? --      Excl. in GC? --    No data found.  Updated Vital Signs BP 100/65 (BP Location: Left Arm)   Pulse 64   Temp 99.2 F (37.3 C) (Oral)   Resp 18   SpO2 98%   Visual Acuity Right Eye Distance:   Left Eye Distance:   Bilateral Distance:    Right Eye Near:   Left Eye Near:    Bilateral Near:     Physical Exam Constitutional:      General: She is not in acute distress.    Appearance: Normal appearance. She is not toxic-appearing or diaphoretic.  HENT:     Head: Normocephalic and atraumatic.  Eyes:     Extraocular Movements: Extraocular movements intact.     Conjunctiva/sclera: Conjunctivae normal.  Cardiovascular:     Rate and Rhythm: Normal rate and regular rhythm.     Pulses: Normal pulses.     Heart sounds: Normal heart sounds.  Pulmonary:     Effort: Pulmonary effort is  normal. No respiratory distress.  Breath sounds: Normal breath sounds.  Abdominal:     General: Bowel sounds are normal. There is no distension.     Palpations: Abdomen is soft.     Tenderness: There is no abdominal tenderness.  Genitourinary:    Comments: Deferred with shared decision making.  Self swab performed. Neurological:     General: No focal deficit present.     Mental Status: She is alert and oriented to person, place, and time. Mental status is at baseline.  Psychiatric:        Mood and Affect: Mood normal.        Behavior: Behavior normal.        Thought Content: Thought content normal.        Judgment: Judgment normal.      UC Treatments / Results  Labs (all labs ordered are listed, but only abnormal results are displayed) Labs Reviewed  POCT URINALYSIS DIP (MANUAL ENTRY) - Abnormal; Notable for the following components:      Result Value   Spec Grav, UA >=1.030 (*)    Blood, UA moderate (*)    All other components within normal limits  POCT URINE PREGNANCY  CERVICOVAGINAL ANCILLARY ONLY    EKG   Radiology No results found.  Procedures Procedures (including critical care time)  Medications Ordered in UC Medications - No data to display  Initial Impression / Assessment and Plan / UC Course  I have reviewed the triage vital signs and the nursing notes.  Pertinent labs & imaging results that were available during my care of the patient were reviewed by me and considered in my medical decision making (see chart for details).     Urinalysis unremarkable for urinary tract infection.  There is moderate amount of red blood cells in urine but patient reported that this is baseline for her.  Suspect vaginitis and highly suspicious of STD given sexual partner has similar symptoms.  Given no confirmed exposure to STD, will await cervicovaginal swab for treatment.  Urine pregnancy was negative.  Patient was advised to follow-up if symptoms persist or worsen.   Patient also advised to refrain from sexual activity until test results and treatment are complete.  Patient verbalized understanding and was agreeable with plan. Final Clinical Impressions(s) / UC Diagnoses   Final diagnoses:  Vaginal discharge  Dysuria  Screening examination for venereal disease     Discharge Instructions      Your urine was unremarkable.  Urine pregnancy test was negative.  Vaginal swab is pending.  We will call if it is abnormal and treat as appropriate.  Please refrain from sexual activity until test results and treatment are complete.    ED Prescriptions   None    PDMP not reviewed this encounter.   Teodora Medici, [place] 11/01/22 [place], [place], [place] 11/01/22 1446

## 2022-11-05 ENCOUNTER — Telehealth (HOSPITAL_COMMUNITY): Payer: Self-pay | Admitting: Emergency Medicine

## 2022-11-05 LAB — CERVICOVAGINAL ANCILLARY ONLY
Bacterial Vaginitis (gardnerella): POSITIVE — AB
Candida Glabrata: NEGATIVE
Candida Vaginitis: POSITIVE — AB
Chlamydia: NEGATIVE
Comment: NEGATIVE
Comment: NEGATIVE
Comment: NEGATIVE
Comment: NEGATIVE
Comment: NEGATIVE
Comment: NORMAL
Neisseria Gonorrhea: NEGATIVE
Trichomonas: NEGATIVE

## 2022-11-05 MED ORDER — FLUCONAZOLE 150 MG PO TABS: 150.0000 mg | ORAL_TABLET | Freq: Once | ORAL | 0 refills | Status: AC

## 2022-11-05 MED ORDER — METRONIDAZOLE 500 MG PO TABS: 500.0000 mg | ORAL_TABLET | Freq: Two times a day (BID) | ORAL | 0 refills | Status: DC

## 2023-09-21 ENCOUNTER — Ambulatory Visit
Admission: EM | Admit: 2023-09-21 | Discharge: 2023-09-21 | Disposition: A | Discharge status: Home / Self Care | Payer: Self-pay | Attending: Physician Assistant | Admitting: Physician Assistant

## 2023-09-21 ENCOUNTER — Other Ambulatory Visit: Payer: Self-pay

## 2023-09-21 DIAGNOSIS — N898 Other specified noninflammatory disorders of vagina: Secondary | ICD-10-CM | POA: Insufficient documentation

## 2023-09-21 LAB — POCT URINE PREGNANCY: Preg Test, Ur: NEGATIVE

## 2023-09-21 NOTE — ED Provider Notes (Signed)
[facility]    CSN: [id number] Arrival date & time: 09/21/23  1048      History   Chief Complaint Chief Complaint  Patient presents with   Vaginitis    HPI Betty Avila is a 27 y.o. female.   Here today for evaluation of irregular periods as well as vaginal itching and discharge.  She reports that she did accidentally use a latex condom and has allergy to latex and is not sure if this close improve her symptoms.  She would like STD screening.  She denies any dysuria.  The history is provided by the patient.    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 Authorizing Provider  acetaminophen (TYLENOL) 325 MG tablet Take 2 tablets (650 mg total) by mouth every 6 (six) hours. 07/11/20   Sheila Oats, MD  coconut oil OIL Apply 1 application topically as needed (nipple pain). 07/11/20   Sheila Oats, MD  Ferrous Sulfate (IRON) 325 (65 Fe) MG  TABS Take 1 tablet (325 mg total) by mouth 2 times daily at 12 noon and 4 pm. 07/11/20   Sheila Oats, MD  ibuprofen (ADVIL) 600 MG tablet Take 1 tablet (600 mg total) by mouth every 6 (six) hours. 07/11/20   Sheila Oats, MD  metroNIDAZOLE (FLAGYL) 500 MG tablet Take 1 tablet (500 mg total) by mouth 2 (two) times daily. 11/05/22   Lamptey, Britta Mccreedy, MD  nitrofurantoin, macrocrystal-monohydrate, (MACROBID) 100 MG capsule Take 1 capsule (100 mg total) by mouth 2 (two) times daily. Patient not taking: Reported on 11/01/2022 03/16/22   Merrilee Jansky, MD  norethindrone (ORTHO MICRONOR) 0.35 MG tablet Take 1 tablet (0.35 mg total) by mouth daily. 07/11/20 07/11/21  Sheila Oats, MD  oxyCODONE (OXY IR/ROXICODONE) 5 MG immediate release tablet Take 1 tablet (5 mg total) by mouth every 6 (six) hours as needed for breakthrough pain. Patient not taking: Reported on 07/29/2021 07/11/20   Sheila Oats, MD  PARoxetine (PAXIL) 20 MG tablet Take 1 tablet (20 mg total) by mouth daily. 04/18/22   Andrey Campanile,  Lauris Poag, MD  polyethylene glycol (MIRALAX / GLYCOLAX) 17 g packet Take 17 g by mouth daily as needed for moderate constipation. 07/11/20   Sheila Oats, MD  Prenatal Vit-Fe Fumarate-FA (MULTIVITAMIN-PRENATAL) 27-0.8 MG TABS tablet Take 1 tablet by mouth daily at 12 noon.    [provider]  sulfamethoxazole-trimethoprim (BACTRIM DS) 800-160 MG tablet Take 1 tablet by mouth 2 (two) times daily. Patient not taking: Reported on 11/01/2022 04/18/22   Georganna Skeans, MD    Family History History reviewed. No pertinent family history.  Social History Social History   Tobacco Use   Smoking status: Former    Types: Cigarettes   Smokeless tobacco: Never   Tobacco comments:    cigar  Vaping Use   Vaping status: Never Used  Substance Use Topics   Alcohol use: Not Currently    Alcohol/week: 0.0 standard drinks of alcohol    Comment: occasion   Drug use: No     Allergies   Latex   Review of  Systems Review of Systems  Constitutional:  Negative for chills and fever.  Eyes:  Negative for discharge and redness.  Gastrointestinal:  Negative for abdominal pain, nausea and vomiting.  Genitourinary:  Positive for vaginal discharge. Negative for dysuria.     Physical Exam Triage Vital Signs ED Triage Vitals  Encounter Vitals Group     BP 09/21/23 1111 97/63     Systolic BP Percentile --      Diastolic BP Percentile --      Pulse Rate 09/21/23 1111 66     Resp 09/21/23 1111 16     Temp 09/21/23 1111 98.3 F (36.8 C)     Temp Source 09/21/23 1111 Oral     SpO2 09/21/23 1111 97 %     Weight 09/21/23 1109 140 lb (63.5 kg)     Height 09/21/23 1109 5\' 8"  (1.727 m)     Head Circumference --      Peak Flow --      Pain Score 09/21/23 1106 0     Pain Loc --      Pain Education --      Exclude from Growth Chart --    No data found.  Updated Vital Signs BP 97/63 (BP Location: Left Arm)   Pulse 66   Temp 98.3 F (36.8 C) (Oral)   Resp 16   Ht 5\' 8"  (1.727 m)   Wt 140 lb (63.5 kg)   LMP 08/20/2023 (Approximate)   SpO2 97%   BMI 21.29 kg/m     Physical Exam Vitals and nursing note reviewed.  Constitutional:      General: She is not in acute distress.    Appearance: Normal appearance. She is not ill-appearing.  HENT:     Head: Normocephalic and atraumatic.  Eyes:     Conjunctiva/sclera: Conjunctivae normal.  Cardiovascular:     Rate and Rhythm: Normal rate.  Pulmonary:     Effort: Pulmonary effort is normal. No respiratory distress.  Neurological:     Mental Status: She is alert.  Psychiatric:        Mood and Affect: Mood normal.        Behavior: Behavior normal.        Thought Content: Thought content normal.      UC Treatments / Results  Labs (all labs ordered are listed, but only abnormal results are displayed) Labs Reviewed  POCT URINE PREGNANCY - Normal  CERVICOVAGINAL ANCILLARY ONLY    EKG  Radiology No results  found.  Procedures Procedures (including critical care time)  Medications Ordered in UC Medications - No data to display  Initial Impression / Assessment and Plan / UC Course  I have reviewed the triage vital signs and the nursing notes.  Pertinent labs & imaging results that were available during my care of the patient were reviewed by me and considered in my medical decision making (see chart for details).    Pregnancy test negative.  Will screen for STDs.  Will await results further recommendation and encouraged follow-up with any further concerns.  Final Clinical Impressions(s) / UC Diagnoses   Final diagnoses:  Vaginal discharge   Discharge Instructions   None    ED Prescriptions   None    PDMP not reviewed this encounter.   Tomi Bamberger, PA-C 09/21/23 1249

## 2023-09-21 NOTE — ED Triage Notes (Signed)
"  I have 2 issues, I have been irregular periods (x2 in the same week), I also think I have a yeast infection, most recently used a latex condom and normally can't use them so now having redness, itching, some white/clear discharged at times". ? Sti. No dysuria. No urinary problems.

## 2023-09-23 ENCOUNTER — Telehealth: Payer: Self-pay | Admitting: Internal Medicine

## 2023-09-23 LAB — CERVICOVAGINAL ANCILLARY ONLY
Bacterial Vaginitis (gardnerella): POSITIVE — AB
Candida Glabrata: NEGATIVE
Candida Vaginitis: POSITIVE — AB
Chlamydia: NEGATIVE
Comment: NEGATIVE
Comment: NEGATIVE
Comment: NEGATIVE
Comment: NEGATIVE
Comment: NEGATIVE
Comment: NORMAL
Neisseria Gonorrhea: NEGATIVE
Trichomonas: NEGATIVE

## 2023-09-23 MED ORDER — METRONIDAZOLE 0.75 % VA GEL: 1.0000 | Freq: Every day | VAGINAL | 0 refills | Status: AC

## 2023-09-23 MED ORDER — FLUCONAZOLE 150 MG PO TABS: 150.0000 mg | ORAL_TABLET | ORAL | 0 refills | Status: AC

## 2023-09-23 NOTE — Telephone Encounter (Signed)
Patient came to the urgent care requesting medication for positive vaginal swab results for BV and vaginal yeast.  Patient left prior to patient being able to answer if she is currently breast-feeding as the chart is giving me a warning that she could be.  Attempted to call patient to inquire about this but there was no answer.  Unable to leave voicemail.  Therefore, will treat with MetroGel given this is safe with breast-feeding as well as Diflucan.

## 2023-11-21 ENCOUNTER — Encounter (HOSPITAL_COMMUNITY): Payer: Self-pay

## 2023-11-21 ENCOUNTER — Emergency Department (HOSPITAL_COMMUNITY): Payer: Self-pay

## 2023-11-21 ENCOUNTER — Other Ambulatory Visit: Payer: Self-pay

## 2023-11-21 ENCOUNTER — Emergency Department (HOSPITAL_COMMUNITY)
Admission: EM | Admit: 2023-11-21 | Discharge: 2023-11-21 | Disposition: A | Discharge status: Home / Self Care | Payer: Self-pay | Attending: Emergency Medicine | Admitting: Emergency Medicine

## 2023-11-21 DIAGNOSIS — R350 Frequency of micturition: Secondary | ICD-10-CM | POA: Insufficient documentation

## 2023-11-21 DIAGNOSIS — Z91040 Latex allergy status: Secondary | ICD-10-CM | POA: Insufficient documentation

## 2023-11-21 DIAGNOSIS — R112 Nausea with vomiting, unspecified: Secondary | ICD-10-CM | POA: Insufficient documentation

## 2023-11-21 DIAGNOSIS — R109 Unspecified abdominal pain: Secondary | ICD-10-CM | POA: Insufficient documentation

## 2023-11-21 DIAGNOSIS — R509 Fever, unspecified: Secondary | ICD-10-CM | POA: Insufficient documentation

## 2023-11-21 DIAGNOSIS — R319 Hematuria, unspecified: Secondary | ICD-10-CM

## 2023-11-21 LAB — BASIC METABOLIC PANEL
Anion gap: 12 (ref 5–15)
BUN: 12 mg/dL (ref 6–20)
CO2: 22 mmol/L (ref 22–32)
Calcium: 9 mg/dL (ref 8.9–10.3)
Chloride: 99 mmol/L (ref 98–111)
Creatinine, Ser: 1.08 mg/dL — ABNORMAL HIGH (ref 0.44–1.00)
GFR, Estimated: >60 mL/min (ref >60)
Glucose, Bld: 161 mg/dL — ABNORMAL HIGH (ref 70–99)
Potassium: 3 mmol/L — ABNORMAL LOW (ref 3.5–5.1)
Sodium: 133 mmol/L — ABNORMAL LOW (ref 135–145)

## 2023-11-21 LAB — URINALYSIS, ROUTINE W REFLEX MICROSCOPIC
Bilirubin Urine: NEGATIVE
Glucose, UA: NEGATIVE mg/dL
Ketones, ur: 20 mg/dL — AB
Nitrite: NEGATIVE
Protein, ur: 100 mg/dL — AB
Specific Gravity, Urine: 1.025 (ref 1.005–1.030)
WBC, UA: >50 WBC/hpf (ref 0–5)
pH: 5 (ref 5.0–8.0)

## 2023-11-21 LAB — CBC
HCT: 36.1 % (ref 36.0–46.0)
Hemoglobin: 12.4 g/dL (ref 12.0–15.0)
MCH: 31.1 pg (ref 26.0–34.0)
MCHC: 34.3 g/dL (ref 30.0–36.0)
MCV: 90.5 fL (ref 80.0–100.0)
Platelets: 180 10*3/uL (ref 150–400)
RBC: 3.99 MIL/uL (ref 3.87–5.11)
RDW: 12.8 % (ref 11.5–15.5)
WBC: 18.7 10*3/uL — ABNORMAL HIGH (ref 4.0–10.5)
nRBC: 0 % (ref 0.0–0.2)

## 2023-11-21 LAB — HCG, SERUM, QUALITATIVE: Preg, Serum: NEGATIVE

## 2023-11-21 MED ORDER — ACETAMINOPHEN 325 MG PO TABS
650.0000 mg | ORAL_TABLET | Freq: Once | ORAL | Status: AC
  Administered 2023-11-21: 650 mg via ORAL
  Filled 2023-11-21: qty 2

## 2023-11-21 MED ORDER — KETOROLAC TROMETHAMINE 30 MG/ML IJ SOLN
15.0000 mg | Freq: Once | INTRAMUSCULAR | Status: AC
  Administered 2023-11-21: 15 mg via INTRAVENOUS
  Filled 2023-11-21: qty 1

## 2023-11-21 MED ORDER — ONDANSETRON HCL 4 MG/2ML IJ SOLN
4.0000 mg | Freq: Once | INTRAMUSCULAR | Status: AC
  Administered 2023-11-21: 4 mg via INTRAVENOUS
  Filled 2023-11-21: qty 2

## 2023-11-21 MED ORDER — KETOROLAC TROMETHAMINE 30 MG/ML IJ SOLN
30.0000 mg | Freq: Once | INTRAMUSCULAR | Status: AC
  Administered 2023-11-21: 30 mg via INTRAVENOUS
  Filled 2023-11-21: qty 1

## 2023-11-21 MED ORDER — ONDANSETRON HCL 4 MG PO TABS: 4.0000 mg | ORAL_TABLET | Freq: Four times a day (QID) | ORAL | 0 refills | Status: AC

## 2023-11-21 MED ORDER — SULFAMETHOXAZOLE-TRIMETHOPRIM 800-160 MG PO TABS
1.0000 | ORAL_TABLET | Freq: Once | ORAL | Status: AC
  Administered 2023-11-21: 1 via ORAL
  Filled 2023-11-21: qty 1

## 2023-11-21 MED ORDER — NAPROXEN 375 MG PO TABS: 375.0000 mg | ORAL_TABLET | Freq: Two times a day (BID) | ORAL | 0 refills | Status: AC

## 2023-11-21 MED ORDER — POTASSIUM CHLORIDE CRYS ER 20 MEQ PO TBCR
40.0000 meq | EXTENDED_RELEASE_TABLET | Freq: Once | ORAL | Status: AC
  Administered 2023-11-21: 40 meq via ORAL
  Filled 2023-11-21: qty 2

## 2023-11-21 MED ORDER — SULFAMETHOXAZOLE-TRIMETHOPRIM 800-160 MG PO TABS: 1.0000 | ORAL_TABLET | Freq: Two times a day (BID) | ORAL | 0 refills | Status: AC

## 2023-11-21 NOTE — ED Triage Notes (Signed)
Pt complaining of flank pain, cloudy urine, fevers/chills, headache and nausea/emesis. Pt concerned that she may have  a UTI, has had them in the past with similar s/s.

## 2023-11-21 NOTE — ED Provider Notes (Signed)
[facility] AT [facility] Provider Note   CSN: [id number] Arrival date & time: 11/21/23  1010     History  Chief Complaint  Patient presents with   Flank Pain    Betty Avila is a 27 y.o. female.  HPI   27 year old female presents emergency department concern for right flank pain, cloudy urine, frequency as well as an episode of fever/chills.  She has been having nausea with a few episodes of emesis.  She has had UTIs in the past, states that this feels similar.  No known history of kidney stones.  She otherwise denies any abnormal vaginal bleeding, discharge.  Home Medications Prior to Admission medications   Medication Sig Start Date End Date Taking? Authorizing Provider  acetaminophen (TYLENOL) 325 MG tablet Take 2 tablets (650 mg total) by mouth every 6 (six) hours. 07/11/20   Sheila Oats, MD  coconut oil OIL Apply 1 application topically as needed (nipple pain). 07/11/20   Sheila Oats, MD  Ferrous Sulfate (IRON) 325 (65 Fe) MG TABS Take 1 tablet (325 mg total) by mouth 2 times daily at 12 noon and 4 pm. 07/11/20   Sheila Oats, MD  fluconazole (DIFLUCAN) 150 MG tablet Take 1 tablet (150 mg total) by mouth every 3 (three) days. 09/23/23   Gustavus Bryant, FNP  ibuprofen (ADVIL) 600 MG tablet Take 1 tablet (600 mg total) by mouth every 6 (six) hours. 07/11/20   Sheila Oats, MD  nitrofurantoin, macrocrystal-monohydrate, (MACROBID) 100 MG capsule Take 1 capsule (100 mg total) by mouth 2 (two) times daily. Patient not taking: Reported on 11/01/2022 03/16/22   Merrilee Jansky, MD  norethindrone (ORTHO MICRONOR) 0.35 MG tablet Take 1 tablet (0.35 mg total) by mouth daily. 07/11/20 07/11/21  Sheila Oats, MD  oxyCODONE (OXY IR/ROXICODONE) 5 MG immediate release tablet Take 1 tablet (5 mg total) by mouth every 6 (six) hours as needed for breakthrough pain. Patient not taking: Reported on 07/29/2021 07/11/20   Sheila Oats, MD   PARoxetine (PAXIL) 20 MG tablet Take 1 tablet (20 mg total) by mouth daily. 04/18/22   Georganna Skeans, MD  polyethylene glycol (MIRALAX / GLYCOLAX) 17 g packet Take 17 g by mouth daily as needed for moderate constipation. 07/11/20   Sheila Oats, MD  Prenatal Vit-Fe Fumarate-FA (MULTIVITAMIN-PRENATAL) 27-0.8 MG TABS tablet Take 1 tablet by mouth daily at 12 noon.    [provider]  sulfamethoxazole-trimethoprim (BACTRIM DS) 800-160 MG tablet Take 1 tablet by mouth 2 (two) times daily. Patient not taking: Reported on 11/01/2022 04/18/22   Georganna Skeans, MD      Allergies    Latex    Review of Systems   Review of Systems  Constitutional:  Positive for chills, fatigue and fever.  Respiratory:  Negative for shortness of breath.   Cardiovascular:  Negative for chest pain.  Gastrointestinal:  Positive for nausea and vomiting. Negative for abdominal pain and diarrhea.  Genitourinary:  Positive for dysuria, flank pain and frequency. Negative for hematuria, vaginal bleeding and vaginal discharge.  Skin:  Negative for rash.  Neurological:  Negative for headaches.    Physical Exam Updated Vital Signs BP 119/85 (BP Location: Left Arm)   Pulse 82   Temp 98.9 F (37.2 C) (Oral)   Resp 17   Ht 5\' 8"  (1.727 m)   Wt 63.5 kg   LMP 11/07/2023 (Approximate)   SpO2 100%   BMI 21.29 kg/m  Physical  Exam Vitals and nursing note reviewed.  Constitutional:      General: She is not in acute distress.    Appearance: Normal appearance.  HENT:     Head: Normocephalic.     Mouth/Throat:     Mouth: Mucous membranes are moist.  Cardiovascular:     Rate and Rhythm: Normal rate.  Pulmonary:     Effort: Pulmonary effort is normal. No respiratory distress.  Abdominal:     Palpations: Abdomen is soft.     Tenderness: There is no abdominal tenderness. There is no guarding.  Skin:    General: Skin is warm.  Neurological:     Mental Status: She is alert and oriented to person, place, and  time. Mental status is at baseline.  Psychiatric:        Mood and Affect: Mood normal.     ED Results / Procedures / Treatments   Labs (all labs ordered are listed, but only abnormal results are displayed) Labs Reviewed  URINALYSIS, ROUTINE W REFLEX MICROSCOPIC - Abnormal; Notable for the following components:      Result Value   Color, Urine AMBER (*)    APPearance CLOUDY (*)    Hgb urine dipstick LARGE (*)    Ketones, ur 20 (*)    Protein, ur 100 (*)    Leukocytes,Ua MODERATE (*)    Bacteria, UA RARE (*)    All other components within normal limits  BASIC METABOLIC PANEL - Abnormal; Notable for the following components:   Sodium 133 (*)    Potassium 3.0 (*)    Glucose, Bld 161 (*)    Creatinine, Ser 1.08 (*)    All other components within normal limits  CBC - Abnormal; Notable for the following components:   WBC 18.7 (*)    All other components within normal limits  HCG, SERUM, QUALITATIVE    EKG None  Radiology No results found.  Procedures Procedures    Medications Ordered in ED Medications  potassium chloride SA (KLOR-CON M) CR tablet 40 mEq (has no administration in time range)  ketorolac (TORADOL) 30 MG/ML injection 30 mg (30 mg Intravenous Given 11/21/23 1244)  ondansetron (ZOFRAN) injection 4 mg (4 mg Intravenous Given 11/21/23 1243)    ED Course/ Medical Decision Making/ A&P                                 Medical Decision Making Amount and/or Complexity of Data Reviewed Labs: ordered. Radiology: ordered.  Risk Prescription drug management.   27 year old female presents to the emergency department with dark urine, right-sided flank pain, fever/chills, nausea/vomiting.  History of frequent UTIs, feels similar to the past.  No history of kidney stones.  Vitals are normal stable on arrival.  Abdomen is slightly tender but benign.  Blood work shows a leukocytosis, urinalysis has a large amount of leukocytes with bacteria and mucus.  Nitrate  negative.  Suspicious for UTI.  CT of the abdomen pelvis shows no kidney Avila or other acute abnormality.  After IV medicine she feels improved.  Will plan to treat with antibiotics and symptom control.  Patient at this time appears safe and stable for discharge and close outpatient follow up. Discharge plan and strict return to ED precautions discussed, patient verbalizes understanding and agreement.        Final Clinical Impression(s) / ED Diagnoses Final diagnoses:  None    Rx / DC Orders ED Discharge Orders  None         Rozelle Logan, DO 11/21/23 1536

## 2023-11-21 NOTE — Discharge Instructions (Signed)
You have been seen and discharged from the emergency department.  Your blood work showed elevated white blood cells in your urinalysis looks suspicious for UTI.  You have been prescribed antibiotics.  The CT of your abdomen pelvis showed no kidney stones or other acute finding.  Take pain medicine and nausea medicine as needed.  Stay well-hydrated.  Follow-up with your primary provider for further evaluation and further care. Take home medications as prescribed. If you have any worsening symptoms or further concerns for your health please return to an emergency department for further evaluation.

## 2023-11-23 LAB — URINE CULTURE: Culture: >=100000 — AB

## 2024-04-12 ENCOUNTER — Ambulatory Visit
Admission: EM | Admit: 2024-04-12 | Discharge: 2024-04-12 | Disposition: A | Discharge status: Home / Self Care | Payer: Self-pay | Attending: Internal Medicine | Admitting: Internal Medicine

## 2024-04-12 ENCOUNTER — Other Ambulatory Visit: Payer: Self-pay

## 2024-04-12 DIAGNOSIS — N898 Other specified noninflammatory disorders of vagina: Secondary | ICD-10-CM | POA: Insufficient documentation

## 2024-04-12 DIAGNOSIS — Z113 Encounter for screening for infections with a predominantly sexual mode of transmission: Secondary | ICD-10-CM | POA: Insufficient documentation

## 2024-04-12 DIAGNOSIS — R200 Anesthesia of skin: Secondary | ICD-10-CM | POA: Insufficient documentation

## 2024-04-12 LAB — POCT URINALYSIS DIP (MANUAL ENTRY)
Bilirubin, UA: NEGATIVE
Glucose, UA: NEGATIVE mg/dL
Ketones, POC UA: NEGATIVE mg/dL
Leukocytes, UA: NEGATIVE
Nitrite, UA: NEGATIVE
Protein Ur, POC: 30 mg/dL — AB
Spec Grav, UA: >=1.03 — AB
Urobilinogen, UA: 0.2 U/dL
pH, UA: 6

## 2024-04-12 NOTE — ED Triage Notes (Signed)
 She reports 2 weeks of ?yeast infection symptoms. Also requesting screening for STDs  Also states she woke up yesterday morning with her hands numb and "change color". States she had numbness from elbow down on her right hand. States she uses her hands a lot - does nails, braids hair etc

## 2024-04-12 NOTE — Discharge Instructions (Addendum)
 Urinalysis done today and is negative for leukocytes or nitrates which indicates no infection. There is some blood but this is likely secondary to being on your period. The screening swab and blood work done today will take 24 to 48 hours to get results.  We have recommended the following:  Can try boric acid supposities for recurrent bacterial vaginitis.  Could consider probiotic or eating yogurt everyday for recurrent bacterial vaginitis. Screening swab and blood work done today and results will be available in 24-48 hours. We will contact you if we need to arrange additional treatment based on your testing. Negative results will be on your MyChart account. We have ordered a CBC and a CMP due to the symptoms of numbness in both hands.  This has been a recent onset and does seem to be improving now.  This may be due to over activity of the hands yesterday however we cannot completely rule out something like carpal tunnel.  Less likely is some type of neurological event such as a stroke but should you develop worsening numbness, facial droop, slurred speech, visual changes, lower extremity weakness then we do recommend going to the emergency room immediately.  We will contact you with the results of your blood work if anything is abnormal.  You may need to consider following up with your primary care physician for this symptom or an orthopedist. Recommend scheduling an appointment with gynecology to get a Pap smear and discuss some of the other concerns. Return to urgent care or PCP if symptoms worsen or fail to resolve.

## 2024-04-12 NOTE — ED Provider Notes (Signed)
 [facility]    CSN: [id number] Arrival date & time: 04/12/24  1143      History   Chief Complaint Chief Complaint  Patient presents with   Vaginal Itching   Hand Pain    HPI Betty Avila is a 28 y.o. female.   28 year old female who presents urgent care with complaints of stringy vaginal discharge that is yellowish in color and bilateral hand numbness and color change.  She reports that the vaginal discharge has been present for a couple of weeks.  It is not getting any better.  She has had a history of this in the past.  She has had recurrent bacterial vaginitis.  She has not tried using any boric acid or any probiotics in the past.  Patient denies any dysuria, hematuria, suprapubic pain but does request a urinalysis.  In regard to the hands, she reports that she was working with her hands a lot yesterday doing nails and braiding hair.  She noted last night that her hands were somewhat tingly with the right being worse.  There was also a weird sensation in her right arm where he did not feel the same as the left.  She has been able to use them.  She is not having any significant pain in them.  Today the symptoms are better and almost resolved but still present.  She denies any fevers or chills.  She denies any injury to the area.  She denies any slurred speech, headache, visual changes, lower extremity symptoms, facial droop, facial paralysis or history of stroke.     Vaginal Itching Pertinent negatives include no chest pain, no abdominal pain and no shortness of breath.  Hand Pain Pertinent negatives include no chest pain, no abdominal pain and no shortness of breath.    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 Authorizing Provider  acetaminophen  (TYLENOL ) 325 MG tablet Take 2 tablets (650 mg total) by mouth every 6 (six) hours. 07/11/20   Goswick, Anna E, MD  coconut oil OIL Apply 1 application topically as needed (nipple pain). 07/11/20  Antonina Kleine, MD  Ferrous Sulfate (IRON ) 325 (65 Fe) MG TABS Take 1 tablet (325 mg total) by mouth 2 times daily at 12 noon and 4 pm. 07/11/20   Antonina Kleine, MD  fluconazole  (DIFLUCAN ) 150 MG tablet Take 1 tablet (150 mg total) by mouth every 3 (three) days. 09/23/23   Dodson Freestone, FNP  ibuprofen  (ADVIL ) 600 MG tablet Take 1 tablet (600 mg total) by mouth every 6 (six) hours. 07/11/20   Antonina Kleine, MD  naproxen  (NAPROSYN ) 375 MG tablet Take 1 tablet (375 mg total) by mouth 2 (two) times daily. 11/21/23   Horton, Sidra Dredge, DO  nitrofurantoin , macrocrystal-monohydrate, (MACROBID ) 100 MG capsule Take 1 capsule (100 mg total) by mouth 2 (two) times daily. Patient not taking: Reported on 11/01/2022 03/16/22   Corine Dice, MD  norethindrone  (ORTHO MICRONOR ) 0.35 MG tablet Take 1 tablet (0.35 mg total) by mouth daily. 07/11/20 07/11/21  Antonina Kleine, MD  ondansetron  (ZOFRAN ) 4 MG  tablet Take 1 tablet (4 mg total) by mouth every 6 (six) hours. 11/21/23   Horton, Sidra Dredge, DO  oxyCODONE  (OXY IR/ROXICODONE ) 5 MG immediate release tablet Take 1 tablet (5 mg total) by mouth every 6 (six) hours as needed for breakthrough pain. Patient not taking: Reported on 07/29/2021 07/11/20   Antonina Kleine, MD  PARoxetine  (PAXIL ) 20 MG tablet Take 1 tablet (20 mg total) by mouth daily. 04/18/22   Abraham Abo, MD  polyethylene glycol (MIRALAX  / GLYCOLAX ) 17 g packet Take 17 g by mouth daily as needed for moderate constipation. 07/11/20   Antonina Kleine, MD  Prenatal Vit-Fe Fumarate-FA (MULTIVITAMIN-PRENATAL) 27-0.8 MG TABS tablet Take 1 tablet by mouth daily at 12 noon.    [provider]    Family History No family history on file.  Social History Social History   Tobacco Use   Smoking status: Former    Types: Cigarettes   Smokeless tobacco: Never   Tobacco comments:    cigar  Vaping Use   Vaping status: Never Used  Substance Use Topics   Alcohol use: Not Currently    Alcohol/week: 0.0 standard drinks of alcohol    Comment: occasion   Drug use: No     Allergies   Latex   Review of Systems Review of Systems  Constitutional:  Negative for chills and fever.  HENT:  Negative for ear pain and sore throat.   Eyes:  Negative for pain and visual disturbance.  Respiratory:  Negative for cough and shortness of breath.   Cardiovascular:  Negative for chest pain and palpitations.  Gastrointestinal:  Negative for abdominal pain and vomiting.  Genitourinary:  Positive for vaginal discharge. Negative for dysuria and hematuria.  Musculoskeletal:  Negative for arthralgias and back pain.  Skin:  Negative for color change and rash.  Neurological:  Positive for numbness (Bilateral hands and right forearm). Negative for seizures and syncope.  All other systems reviewed and are negative.    Physical Exam Triage Vital Signs ED Triage Vitals  Encounter Vitals Group      BP 04/12/24 1258 119/83     Systolic BP Percentile --      Diastolic BP Percentile --      Pulse Rate 04/12/24 1258 60     Resp 04/12/24 1258 18     Temp 04/12/24 1258 98.4 F (36.9 C)     Temp Source 04/12/24 1258 Oral     SpO2 04/12/24 1258 98 %  Weight --      Height --      Head Circumference --      Peak Flow --      Pain Score 04/12/24 1303 1     Pain Loc --      Pain Education --      Exclude from Growth Chart --    No data found.  Updated Vital Signs BP 119/83 (BP Location: Left Arm)   Pulse 60   Temp 98.4 F (36.9 C) (Oral)   Resp 18   LMP 04/06/2024   SpO2 98%   Breastfeeding No   Visual Acuity Right Eye Distance:   Left Eye Distance:   Bilateral Distance:    Right Eye Near:   Left Eye Near:    Bilateral Near:     Physical Exam Vitals and nursing note reviewed.  Constitutional:      General: She is not in acute distress.    Appearance: She is well-developed.  HENT:     Head: Normocephalic and atraumatic.  Eyes:     Conjunctiva/sclera: Conjunctivae normal.  Cardiovascular:     Rate and Rhythm: Normal rate and regular rhythm.     Heart sounds: No murmur heard. Pulmonary:     Effort: Pulmonary effort is normal. No respiratory distress.     Breath sounds: Normal breath sounds.  Abdominal:     Palpations: Abdomen is soft.     Tenderness: There is no abdominal tenderness.  Musculoskeletal:        General: No swelling.     Right hand: Normal. No tenderness. Normal range of motion. Normal strength. Normal sensation. Normal capillary refill. Normal pulse.     Left hand: Normal. No tenderness. Normal range of motion. Normal strength. Normal sensation. Normal capillary refill. Normal pulse.     Cervical back: Neck supple.     Comments: Radial and ulnar pulses are palpable, capillary refill is brisk in all fingers and the palm.  No deformities or swelling is present.  Skin:    General: Skin is warm and dry.     Capillary Refill: Capillary refill takes  less than 2 seconds.  Neurological:     Mental Status: She is alert.  Psychiatric:        Mood and Affect: Mood normal.      UC Treatments / Results  Labs (all labs ordered are listed, but only abnormal results are displayed) Labs Reviewed  POCT URINALYSIS DIP (MANUAL ENTRY) - Abnormal; Notable for the following components:      Result Value   Spec Grav, UA >=1.030 (*)    Blood, UA large (*)    Protein Ur, POC =30 (*)    All other components within normal limits  RPR  HIV ANTIBODY (ROUTINE TESTING W REFLEX)  COMPREHENSIVE METABOLIC PANEL WITH GFR  CBC  CERVICOVAGINAL ANCILLARY ONLY    EKG   Radiology No results found.  Procedures Procedures (including critical care time)  Medications Ordered in UC Medications - No data to display  Initial Impression / Assessment and Plan / UC Course  I have reviewed the triage vital signs and the nursing notes.  Pertinent labs & imaging results that were available during my care of the patient were reviewed by me and considered in my medical decision making (see chart for details).     Bilateral hand numbness - Plan: Comprehensive metabolic panel, CBC, Comprehensive metabolic panel, CBC  Screening examination for STD (sexually transmitted disease) - Plan: RPR, HIV Antibody (  routine testing w rflx), RPR, HIV Antibody (routine testing w rflx)  Vaginal discharge   Urinalysis done today and is negative for leukocytes or nitrates which indicates no infection. There is some blood but this is likely secondary to being on your period. The screening swab and blood work done today will take 24 to 48 hours to get results.  We have recommended the following:  Can try boric acid supposities for recurrent bacterial vaginitis.  Could consider probiotic or eating yogurt everyday for recurrent bacterial vaginitis. Screening swab and blood work done today and results will be available in 24-48 hours. We will contact you if we need to arrange  additional treatment based on your testing. Negative results will be on your MyChart account. We have ordered a CBC and a CMP due to the symptoms of numbness in both hands.  This has been a recent onset and does seem to be improving now.  This may be due to over activity of the hands yesterday however we cannot completely rule out something like carpal tunnel.  Less likely is some type of neurological event such as a stroke but should you develop worsening numbness, facial droop, slurred speech, visual changes, lower extremity weakness then we do recommend going to the emergency room immediately.  We will contact you with the results of your blood work if anything is abnormal.  You may need to consider following up with your primary care physician for this symptom or an orthopedist. Recommend scheduling an appointment with gynecology to get a Pap smear and discuss some of the other concerns. Return to urgent care or PCP if symptoms worsen or fail to resolve.    Final Clinical Impressions(s) / UC Diagnoses   Final diagnoses:  Bilateral hand numbness  Screening examination for STD (sexually transmitted disease)  Vaginal discharge     Discharge Instructions      Urinalysis done today and is negative for leukocytes or nitrates which indicates no infection. There is some blood but this is likely secondary to being on your period. The screening swab and blood work done today will take 24 to 48 hours to get results.  We have recommended the following:  Can try boric acid supposities for recurrent bacterial vaginitis.  Could consider probiotic or eating yogurt everyday for recurrent bacterial vaginitis. Screening swab and blood work done today and results will be available in 24-48 hours. We will contact you if we need to arrange additional treatment based on your testing. Negative results will be on your MyChart account. We have ordered a CBC and a CMP due to the symptoms of numbness in both hands.   This has been a recent onset and does seem to be improving now.  This may be due to over activity of the hands yesterday however we cannot completely rule out something like carpal tunnel.  Less likely is some type of neurological event such as a stroke but should you develop worsening numbness, facial droop, slurred speech, visual changes, lower extremity weakness then we do recommend going to the emergency room immediately.  We will contact you with the results of your blood work if anything is abnormal.  You may need to consider following up with your primary care physician for this symptom or an orthopedist. Recommend scheduling an appointment with gynecology to get a Pap smear and discuss some of the other concerns. Return to urgent care or PCP if symptoms worsen or fail to resolve.     ED Prescriptions  None    PDMP not reviewed this encounter.   Kreg Pesa, PA-C 04/12/24 1539

## 2024-04-13 LAB — CERVICOVAGINAL ANCILLARY ONLY
Bacterial Vaginitis (gardnerella): NEGATIVE
Candida Glabrata: NEGATIVE
Candida Vaginitis: NEGATIVE
Chlamydia: NEGATIVE
Comment: NEGATIVE
Comment: NEGATIVE
Comment: NEGATIVE
Comment: NEGATIVE
Comment: NEGATIVE
Comment: NORMAL
Neisseria Gonorrhea: NEGATIVE
Trichomonas: NEGATIVE

## 2024-04-14 ENCOUNTER — Ambulatory Visit (HOSPITAL_COMMUNITY): Payer: Self-pay

## 2024-04-14 LAB — COMPREHENSIVE METABOLIC PANEL WITH GFR
ALT: 10 IU/L (ref 0–32)
AST: 18 IU/L (ref 0–40)
Albumin: 4.7 g/dL (ref 4.0–5.0)
Alkaline Phosphatase: 73 IU/L (ref 44–121)
BUN/Creatinine Ratio: 14 (ref 9–23)
BUN: 11 mg/dL (ref 6–20)
Bilirubin Total: 0.3 mg/dL (ref 0.0–1.2)
CO2: 19 mmol/L — ABNORMAL LOW (ref 20–29)
Calcium: 9.5 mg/dL (ref 8.7–10.2)
Chloride: 105 mmol/L (ref 96–106)
Creatinine, Ser: 0.79 mg/dL (ref 0.57–1.00)
Globulin, Total: 2.6 g/dL (ref 1.5–4.5)
Glucose: 84 mg/dL (ref 70–99)
Potassium: 3.8 mmol/L (ref 3.5–5.2)
Sodium: 144 mmol/L (ref 134–144)
Total Protein: 7.3 g/dL (ref 6.0–8.5)
eGFR: 106 mL/min/{1.73_m2} (ref >59)

## 2024-04-14 LAB — HIV ANTIBODY (ROUTINE TESTING W REFLEX): HIV Screen 4th Generation wRfx: NONREACTIVE

## 2024-04-14 LAB — CBC
Hematocrit: 40.2 % (ref 34.0–46.6)
Hemoglobin: 12.9 g/dL (ref 11.1–15.9)
MCH: 30.5 pg (ref 26.6–33.0)
MCHC: 32.1 g/dL (ref 31.5–35.7)
MCV: 95 fL (ref 79–97)
Platelets: 240 10*3/uL (ref 150–450)
RBC: 4.23 x10E6/uL (ref 3.77–5.28)
RDW: 12.7 % (ref 11.7–15.4)
WBC: 6.5 10*3/uL (ref 3.4–10.8)

## 2024-04-14 LAB — RPR: RPR Ser Ql: NONREACTIVE

## 2024-07-17 ENCOUNTER — Other Ambulatory Visit: Payer: Self-pay

## 2024-07-17 ENCOUNTER — Emergency Department (HOSPITAL_COMMUNITY)
Admission: EM | Admit: 2024-07-17 | Discharge: 2024-07-17 | Disposition: A | Discharge status: Home / Self Care | Payer: Self-pay | Attending: Emergency Medicine | Admitting: Emergency Medicine

## 2024-07-17 DIAGNOSIS — Z91040 Latex allergy status: Secondary | ICD-10-CM | POA: Insufficient documentation

## 2024-07-17 DIAGNOSIS — N939 Abnormal uterine and vaginal bleeding, unspecified: Secondary | ICD-10-CM | POA: Insufficient documentation

## 2024-07-17 LAB — URINALYSIS, ROUTINE W REFLEX MICROSCOPIC
Bacteria, UA: NONE SEEN
Bilirubin Urine: NEGATIVE
Glucose, UA: NEGATIVE mg/dL
Ketones, ur: 20 mg/dL — AB
Leukocytes,Ua: NEGATIVE
Nitrite: NEGATIVE
Protein, ur: NEGATIVE mg/dL
RBC / HPF: >50 RBC/hpf (ref 0–5)
Specific Gravity, Urine: 1.017 (ref 1.005–1.030)
pH: 5 (ref 5.0–8.0)

## 2024-07-17 LAB — CBC WITH DIFFERENTIAL/PLATELET
Abs Immature Granulocytes: 0.04 K/uL (ref 0.00–0.07)
Basophils Absolute: 0 K/uL (ref 0.0–0.1)
Basophils Relative: 0 %
Eosinophils Absolute: 0 K/uL (ref 0.0–0.5)
Eosinophils Relative: 1 %
HCT: 37.6 % (ref 36.0–46.0)
Hemoglobin: 12.2 g/dL (ref 12.0–15.0)
Immature Granulocytes: 1 %
Lymphocytes Relative: 26 %
Lymphs Abs: 2.1 K/uL (ref 0.7–4.0)
MCH: 30 pg (ref 26.0–34.0)
MCHC: 32.4 g/dL (ref 30.0–36.0)
MCV: 92.6 fL (ref 80.0–100.0)
Monocytes Absolute: 0.4 K/uL (ref 0.1–1.0)
Monocytes Relative: 5 %
Neutro Abs: 5.5 K/uL (ref 1.7–7.7)
Neutrophils Relative %: 67 %
Platelets: 256 K/uL (ref 150–400)
RBC: 4.06 MIL/uL (ref 3.87–5.11)
RDW: 12.7 % (ref 11.5–15.5)
WBC: 8.1 K/uL (ref 4.0–10.5)
nRBC: 0 % (ref 0.0–0.2)

## 2024-07-17 LAB — COMPREHENSIVE METABOLIC PANEL WITH GFR
ALT: 14 U/L (ref 0–44)
AST: 18 U/L (ref 15–41)
Albumin: 4.4 g/dL (ref 3.5–5.0)
Alkaline Phosphatase: 59 U/L (ref 38–126)
Anion gap: 11 (ref 5–15)
BUN: 14 mg/dL (ref 6–20)
CO2: 23 mmol/L (ref 22–32)
Calcium: 9.3 mg/dL (ref 8.9–10.3)
Chloride: 108 mmol/L (ref 98–111)
Creatinine, Ser: 0.71 mg/dL (ref 0.44–1.00)
GFR, Estimated: >60 mL/min (ref >60)
Glucose, Bld: 101 mg/dL — ABNORMAL HIGH (ref 70–99)
Potassium: 3.6 mmol/L (ref 3.5–5.1)
Sodium: 142 mmol/L (ref 135–145)
Total Bilirubin: 0.6 mg/dL (ref 0.0–1.2)
Total Protein: 7.8 g/dL (ref 6.5–8.1)

## 2024-07-17 LAB — HCG, QUANTITATIVE, PREGNANCY: hCG, Beta Chain, Quant, S: <1 m[IU]/mL (ref <5)

## 2024-07-17 NOTE — ED Triage Notes (Signed)
 Pt here with c/o vaginal bleeding for over a week, bleeding increased today. Last period was 06/12/2024- pt has not taken a pregnancy test.

## 2024-07-17 NOTE — ED Provider Triage Note (Signed)
 Emergency Medicine Provider Triage Evaluation Note  Betty Avila , a 28 y.o. female  was evaluated in triage.  Pt complains of bleeding.  She is unsure if she is pregnant.  Did not take a home test.  She is concerned about miscarriage.  Review of Systems  Positive: As above Negative: As above  Physical Exam  BP 113/76 (BP Location: Right Arm)   Pulse 82   Temp 98.5 F (36.9 C) (Oral)   Resp 18   Ht 5' 8 (1.727 m)   Wt 63.5 kg   SpO2 100%   BMI 21.29 kg/m  Gen:   Awake, no distress   Resp:  Normal effort MSK:   Moves extremities without difficulty  Other:    Medical Decision Making  Medically screening exam initiated at 6:14 PM.  Appropriate orders placed.  Danese Garcia-Verdin was informed that the remainder of the evaluation will be completed by another provider, this initial triage assessment does not replace that evaluation, and the importance of remaining in the ED until their evaluation is complete.     Hildegard Loge, PA-C 07/17/24 1815

## 2024-07-17 NOTE — Discharge Instructions (Signed)
 Please follow-up with the OB/GYN listed below for reevaluation of your uterine bleeding.  There is no evidence of any dangerous condition today.  You may continue using over-the-counter medications as needed if you develop cramping.  Follow-up with your primary care doctor and return to the ER with any severe symptoms.

## 2024-07-17 NOTE — ED Provider Notes (Addendum)
 [place] EMERGENCY DEPARTMENT AT [facility] Provider Note   CSN: [id number] Arrival date & time: 07/17/24  1743     Patient presents with: Vaginal Bleeding   Betty Avila is a 28 y.o. female with history of 2 pregnancies in the past who presents with concern for vaginal bleeding for the last 10 days, spotting until today when it increased slightly in volume though's still not consistent with her normal volume for bleeding during a period.  Her last menstrual cycle was 06/12/2024; she states that that cycle was about a week late, and then she started spotting on 07/05/2024.  She states that she had all of the normal symptoms of pregnancy but she cannot elaborate on this for the last month but never took a home pregnancy test.  Presents today due to slight increase in the amount of vaginal bleeding today.  Does not have any abdominal pain or cramping, no vaginal discharge preceding this.  She denies any concern for exposure to STI at this time.  No urinary symptoms.  History of anemia during her pregnancies, denies any medications daily.   HPI     Prior to Admission medications   Medication Sig Start Date End Date Taking? Authorizing Provider  acetaminophen  (TYLENOL ) 325 MG tablet Take 2 tablets (650 mg total) by mouth every 6 (six) hours. 07/11/20   Goswick, Anna E, MD  coconut oil OIL Apply 1 application topically as needed (nipple pain). 07/11/20   Myrl Therisa BRAVO, MD  Ferrous Sulfate (IRON ) 325 (65 Fe) MG TABS Take 1 tablet (325 mg total) by mouth 2 times daily at 12 noon and 4 pm. 07/11/20   Myrl Therisa BRAVO, MD  fluconazole  (DIFLUCAN ) 150 MG tablet Take 1 tablet (150 mg total) by mouth every 3 (three) days. 09/23/23   Hazen Darryle BRAVO, FNP  ibuprofen  (ADVIL ) 600 MG tablet Take 1 tablet (600 mg total) by mouth every 6 (six) hours. 07/11/20   Myrl Therisa BRAVO, MD  naproxen  (NAPROSYN ) 375 MG tablet Take 1 tablet (375 mg total) by mouth 2 (two) times daily. 11/21/23    Horton, Roxie HERO, DO  nitrofurantoin , macrocrystal-monohydrate, (MACROBID ) 100 MG capsule Take 1 capsule (100 mg total) by mouth 2 (two) times daily. Patient not taking: Reported on 11/01/2022 03/16/22   Blaise Aleene KIDD, MD  norethindrone  (ORTHO MICRONOR ) 0.35 MG tablet Take 1 tablet (0.35 mg total) by mouth daily. 07/11/20 07/11/21  Myrl Therisa BRAVO, MD  ondansetron  (ZOFRAN ) 4 MG tablet Take 1 tablet (4 mg total) by mouth every 6 (six) hours. 11/21/23   Horton, Roxie HERO, DO  oxyCODONE  (OXY IR/ROXICODONE ) 5 MG immediate release tablet Take 1 tablet (5 mg total) by mouth every 6 (six) hours as needed for breakthrough pain. Patient not taking: Reported on 07/29/2021 07/11/20   Myrl Therisa BRAVO, MD  PARoxetine  (PAXIL ) 20 MG tablet Take 1 tablet (20 mg total) by mouth daily. 04/18/22   Tanda Bleacher, MD  polyethylene glycol (MIRALAX  / GLYCOLAX ) 17 g packet Take 17 g by mouth daily as needed for moderate constipation. 07/11/20   Myrl Therisa BRAVO, MD  Prenatal Vit-Fe Fumarate-FA (MULTIVITAMIN-PRENATAL) 27-0.8 MG TABS tablet Take 1 tablet by mouth daily at 12 noon.    [provider]    Allergies: Latex    Review of Systems  Constitutional: Negative.   HENT: Negative.    Cardiovascular: Negative.   Gastrointestinal: Negative.   Genitourinary:  Positive for vaginal bleeding.    Updated Vital Signs BP (!) 109/95 (  BP Location: Left Arm)   Pulse 70   Temp 98.3 F (36.8 C) (Oral)   Resp 18   Ht 5' 8 (1.727 m)   Wt 63.5 kg   SpO2 100%   BMI 21.29 kg/m   Physical Exam Vitals and nursing note reviewed. Exam conducted with a chaperone present.  Constitutional:      Appearance: She is not ill-appearing or toxic-appearing.  HENT:     Head: Normocephalic and atraumatic.     Mouth/Throat:     Mouth: Mucous membranes are moist.     Pharynx: No oropharyngeal exudate or posterior oropharyngeal erythema.  Eyes:     General:        Right eye: No discharge.        Left eye: No discharge.      Conjunctiva/sclera: Conjunctivae normal.  Cardiovascular:     Rate and Rhythm: Normal rate and regular rhythm.     Pulses: Normal pulses.     Heart sounds: Normal heart sounds. No murmur heard.    No gallop.  Pulmonary:     Effort: Pulmonary effort is normal. No respiratory distress.     Breath sounds: Normal breath sounds. No wheezing or rales.  Abdominal:     General: There is no distension.     Tenderness: There is no abdominal tenderness. There is no right CVA tenderness, left CVA tenderness, guarding or rebound.  Genitourinary:    General: Normal vulva.     Vagina: Bleeding present.     Cervix: Normal. No cervical motion tenderness.     Adnexa: Right adnexa normal and left adnexa normal.  Musculoskeletal:        General: No deformity.     Cervical back: Neck supple.  Skin:    General: Skin is warm and dry.     Capillary Refill: Capillary refill takes less than 2 seconds.  Neurological:     General: No focal deficit present.     Mental Status: She is alert and oriented to person, place, and time. Mental status is at baseline.  Psychiatric:        Mood and Affect: Mood normal.     (all labs ordered are listed, but only abnormal results are displayed) Labs Reviewed  URINALYSIS, ROUTINE W REFLEX MICROSCOPIC - Abnormal; Notable for the following components:      Result Value   Hgb urine dipstick LARGE (*)    Ketones, ur 20 (*)    All other components within normal limits  COMPREHENSIVE METABOLIC PANEL WITH GFR - Abnormal; Notable for the following components:   Glucose, Bld 101 (*)    All other components within normal limits  WET PREP, GENITAL  CBC WITH DIFFERENTIAL/PLATELET  HCG, QUANTITATIVE, PREGNANCY  RAPID URINE DRUG SCREEN, HOSP PERFORMED  GC/CHLAMYDIA PROBE AMP ([place]) NOT AT [facility]    EKG: None  Radiology: No results found.   Procedures   Medications Ordered in the ED - No data to display                                  Medical Decision  Making 28 year old female presents with concern for irregular vaginal bleeding.  Normal vital signs on intake.  Cardiopulmonary exam unremarkable abdominal exam is benign.  GU exam as above.  DDx includes but limited to menstrual cycle, AB, threatened AB, missed AB, implantation bleeding, ectopic pregnancy, STI, vaginal yeast infection, BV.    Amount  and/or Complexity of Data Reviewed Labs: ordered.    Details: CBC unremarkable, CMP unremarkable, UA with hemoglobinuria but no evidence of infection.  Quantitative hCG less than 1.  GC/chlamydia pending and wet prep pending at time of patient's request for d/c.  Risk Prescription drug management.   Wet prep and GC/chlamydia pending at time of patient's discharge.  Patient strong preference to be discharged at this time with follow-up for wet prep in her MyChart.  Recommend close outpatient follow-up with her OB/GYN at the faculty practice for further evaluation of her abnormal uterine bleeding.  Return precautions discussed.  D[place]re voiced understanding of her medical evaluation and treatment plan. Each of their questions answered to their expressed satisfaction.  Return precautions were given.  Patient is well-appearing, stable, and was discharged in good condition.  This chart was dictated using voice recognition software, Dragon. Despite the best efforts of this provider to proofread and correct errors, errors may still occur which can change documentation meaning.  ADDENDUM: wet prep + for clue cells, flagyl  sent to patient's pharmacy electronically. 00:34 07/18/24.    Final diagnoses:  Vaginal bleeding    ED Discharge Orders     None          Bobette Pleasant SAUNDERS, PA-C 07/17/24 2351    Azura Tufaro, Pleasant SAUNDERS, PA-C 07/18/24 0034    Francesca Elsie CROME, MD 07/18/24 [phone]

## 2024-07-18 LAB — WET PREP, GENITAL
Sperm: NONE SEEN
Trich, Wet Prep: NONE SEEN
WBC, Wet Prep HPF POC: >=10 — AB (ref <10)
Yeast Wet Prep HPF POC: NONE SEEN

## 2024-07-18 MED ORDER — METRONIDAZOLE 500 MG PO TABS: 500.0000 mg | ORAL_TABLET | Freq: Two times a day (BID) | ORAL | 0 refills | Status: AC

## 2024-07-20 ENCOUNTER — Encounter: Payer: Self-pay | Admitting: Family Medicine

## 2024-07-20 LAB — GC/CHLAMYDIA PROBE AMP (~~LOC~~) NOT AT ARMC
Chlamydia: NEGATIVE
Comment: NEGATIVE
Comment: NORMAL
Neisseria Gonorrhea: NEGATIVE
